# Patient Record
Sex: Female | Born: 1990 | Race: Black or African American | Hispanic: No | Marital: Single | Smoking: Current every day smoker
Health system: Southern US, Community
[De-identification: ages and names within clinical notes are randomized; demographics above are authoritative.]

## PROBLEM LIST (undated history)

## (undated) DIAGNOSIS — Z789 Other specified health status: Secondary | ICD-10-CM

## (undated) DIAGNOSIS — N904 Leukoplakia of vulva: Secondary | ICD-10-CM

## (undated) HISTORY — DX: Leukoplakia of vulva: N90.4

## (undated) HISTORY — PX: FOOT SURGERY: SHX648

---

## 2008-12-02 LAB — CHLAMYDIA/GC PCR
Chlamydia amplified: NEGATIVE
N. gonorrhea, amplified: NEGATIVE

## 2008-12-02 LAB — CULTURE, URINE
Culture result:: 40000
Culture: 40000

## 2010-12-18 LAB — METABOLIC PANEL, COMPREHENSIVE
A-G Ratio: 0.9 (ref 0.8–1.7)
ALT (SGPT): 33 U/L (ref 30–65)
AST (SGOT): 17 U/L (ref 15–37)
Albumin: 3.6 g/dL (ref 3.4–5.0)
Alk. phosphatase: 68 U/L (ref 50–136)
Anion gap: 6 mmol/L (ref 5–15)
BUN/Creatinine ratio: 9 — ABNORMAL LOW (ref 12–20)
BUN: 10 MG/DL (ref 7–18)
Bilirubin, total: 0.5 MG/DL (ref 0.2–1.0)
CO2: 28 MMOL/L (ref 21–32)
Calcium: 8.4 MG/DL (ref 8.4–10.4)
Chloride: 104 MMOL/L (ref 100–108)
Creatinine: 1.1 MG/DL (ref 0.6–1.3)
GFR est AA: >60 mL/min/{1.73_m2} (ref >60)
GFR est non-AA: >60 mL/min/{1.73_m2} (ref >60)
Globulin: 4.1 g/dL — ABNORMAL HIGH (ref 2.0–4.0)
Glucose: 103 MG/DL — ABNORMAL HIGH (ref 74–99)
Potassium: 3.1 MMOL/L — ABNORMAL LOW (ref 3.5–5.5)
Protein, total: 7.7 g/dL (ref 6.4–8.2)
Sodium: 138 MMOL/L (ref 136–145)

## 2010-12-18 LAB — CBC WITH AUTOMATED DIFF
ABS. BASOPHILS: 0 10*3/uL (ref 0.0–0.06)
ABS. EOSINOPHILS: 0 10*3/uL (ref 0.0–0.4)
ABS. LYMPHOCYTES: 0.7 10*3/uL — ABNORMAL LOW (ref 0.9–3.6)
ABS. MONOCYTES: 0.4 10*3/uL (ref 0.05–1.2)
ABS. NEUTROPHILS: 5.8 10*3/uL (ref 1.8–8.0)
BASOPHILS: 0 % (ref 0–2)
EOSINOPHILS: 0 % (ref 0–5)
HCT: 38.5 % (ref 35.0–45.0)
HGB: 12.7 g/dL (ref 12.0–16.0)
LYMPHOCYTES: 10 % — ABNORMAL LOW (ref 21–52)
MCH: 26.2 PG (ref 24.0–34.0)
MCHC: 33 g/dL (ref 31.0–37.0)
MCV: 79.4 FL (ref 74.0–97.0)
MONOCYTES: 6 % (ref 3–10)
MPV: 10 FL (ref 9.2–11.8)
NEUTROPHILS: 84 % — ABNORMAL HIGH (ref 40–73)
PLATELET: 283 10*3/uL (ref 135–420)
RBC: 4.85 M/uL (ref 4.20–5.30)
RDW: 13.8 % (ref 11.6–14.5)
WBC: 6.9 10*3/uL (ref 4.6–13.2)

## 2010-12-18 LAB — URINALYSIS W/ RFLX MICROSCOPIC
Bilirubin: NEGATIVE
Blood: NEGATIVE
Glucose: NEGATIVE MG/DL
Ketone: NEGATIVE MG/DL
Nitrites: NEGATIVE
Protein: 30 MG/DL — AB
Specific gravity: 1.025 (ref 1.003–1.030)
Urobilinogen: 0.2 EU/DL (ref 0.2–1.0)
pH (UA): 6 (ref 5.0–8.0)

## 2010-12-18 LAB — URINE MICROSCOPIC ONLY
Epithelial cells: 30 /LPF (ref 0–5)
RBC: 2 /HPF (ref 0–5)
WBC: 12 /HPF (ref 0–5)

## 2010-12-18 LAB — LIPASE: Lipase: 77 U/L (ref 73–393)

## 2014-07-21 LAB — URINALYSIS W/ RFLX MICROSCOPIC
Bilirubin: NEGATIVE
Blood: NEGATIVE
Glucose: NEGATIVE mg/dL
Ketone: NEGATIVE mg/dL
Leukocyte Esterase: NEGATIVE
Nitrites: NEGATIVE
Specific gravity: >1.03 — ABNORMAL HIGH (ref 1.003–1.030)
Urobilinogen: 0.2 EU/dL (ref 0.2–1.0)
pH (UA): 6 (ref 5.0–8.0)

## 2014-07-21 LAB — CBC WITH AUTOMATED DIFF
ABS. BASOPHILS: 0 10*3/uL (ref 0.0–0.06)
ABS. EOSINOPHILS: 0 10*3/uL (ref 0.0–0.4)
ABS. LYMPHOCYTES: 1 10*3/uL (ref 0.9–3.6)
ABS. MONOCYTES: 0.3 10*3/uL (ref 0.05–1.2)
ABS. NEUTROPHILS: 5.1 10*3/uL (ref 1.8–8.0)
BASOPHILS: 0 % (ref 0–2)
EOSINOPHILS: 0 % (ref 0–5)
HCT: 37 % (ref 35.0–45.0)
HGB: 12.7 g/dL (ref 12.0–16.0)
LYMPHOCYTES: 16 % — ABNORMAL LOW (ref 21–52)
MCH: 27.1 PG (ref 24.0–34.0)
MCHC: 34.3 g/dL (ref 31.0–37.0)
MCV: 79.1 FL (ref 74.0–97.0)
MONOCYTES: 4 % (ref 3–10)
MPV: 10.2 FL (ref 9.2–11.8)
NEUTROPHILS: 80 % — ABNORMAL HIGH (ref 40–73)
PLATELET: 221 10*3/uL (ref 135–420)
RBC: 4.68 M/uL (ref 4.20–5.30)
RDW: 13.1 % (ref 11.6–14.5)
WBC: 6.4 10*3/uL (ref 4.6–13.2)

## 2014-07-21 LAB — CARDIAC PANEL,(CK, CKMB & TROPONIN)
CK - MB: 0.7 ng/ml (ref 0.5–3.6)
CK-MB Index: 0.5 % (ref 0.0–4.0)
CK: 150 U/L (ref 26–192)
Troponin-I, QT: <0.02 NG/ML (ref 0.00–0.06)

## 2014-07-21 LAB — METABOLIC PANEL, COMPREHENSIVE
A-G Ratio: 0.7 — ABNORMAL LOW (ref 0.8–1.7)
ALT (SGPT): 18 U/L (ref 13–56)
AST (SGOT): 10 U/L — ABNORMAL LOW (ref 15–37)
Albumin: 3 g/dL — ABNORMAL LOW (ref 3.4–5.0)
Alk. phosphatase: 59 U/L (ref 45–117)
Anion gap: 10 mmol/L (ref 3.0–18)
BUN/Creatinine ratio: 8 — ABNORMAL LOW (ref 12–20)
BUN: 7 MG/DL (ref 7.0–18)
Bilirubin, total: 0.2 MG/DL (ref 0.2–1.0)
CO2: 25 mmol/L (ref 21–32)
Calcium: 8.7 MG/DL (ref 8.5–10.1)
Chloride: 104 mmol/L (ref 100–108)
Creatinine: 0.91 MG/DL (ref 0.6–1.3)
GFR est AA: >60 mL/min/{1.73_m2} (ref >60)
GFR est non-AA: >60 mL/min/{1.73_m2} (ref >60)
Globulin: 4.1 g/dL — ABNORMAL HIGH (ref 2.0–4.0)
Glucose: 166 mg/dL — ABNORMAL HIGH (ref 74–99)
Potassium: 3.6 mmol/L (ref 3.5–5.5)
Protein, total: 7.1 g/dL (ref 6.4–8.2)
Sodium: 139 mmol/L (ref 136–145)

## 2014-07-21 LAB — TSH 3RD GENERATION: TSH: 3.75 u[IU]/mL — ABNORMAL HIGH (ref 0.36–3.74)

## 2014-07-21 LAB — HCG URINE, QL. - POC: Pregnancy test,urine (POC): NEGATIVE

## 2014-07-21 LAB — URINE MICROSCOPIC ONLY
RBC: 0 /hpf (ref 0–5)
WBC: 0 /hpf (ref 0–5)

## 2014-07-21 MED ORDER — ONDANSETRON 4 MG TAB, RAPID DISSOLVE
4 mg | ORAL_TABLET | Freq: Three times a day (TID) | ORAL | Status: AC | PRN
Start: 2014-07-21 — End: ?

## 2014-07-21 MED ORDER — SODIUM CHLORIDE 0.9% BOLUS IV
0.9 % | Freq: Once | INTRAVENOUS | Status: AC
Start: 2014-07-21 — End: 2014-07-21
  Administered 2014-07-21: 18:00:00 via INTRAVENOUS

## 2014-07-21 NOTE — ED Notes (Signed)
Patient states she around 0900 she started feeling weak, had a rapid heart rate and felt short of breath. Patient thought her blood sugar might be low so she ate some cookies, but it had no effect.

## 2014-07-21 NOTE — ED Provider Notes (Signed)
HPI Comments:   1:15 PM   Kristy Avila is a 23 y.o. female presenting to the ED w/ multiple complaints including: SOB/gasping for air while sleeping this AM, also palpitations, feeling weak, shaky and dizziness on standing. States symptoms started while walking out the door to go to work. Thought it was her blood sugar so she ate some food. Has experienced these symptoms 2 previous episodes, 3 mo ago and again last wk both while at work. Symptoms resolved on their own. She also feels sad and wants to cry but is unable to explain why. Denies depression, SI/HI.  No family hx of seizures. PMHx of hypothyroidism. PERC negative. Denies hx anxiety.     The history is provided by the patient. No language interpreter was used.   Written by Suzi Roots, ED Scribe, as dictated by Marden Noble, PA-C.      Past Medical History   Diagnosis Date   ??? Hypothyroidism         History reviewed. No pertinent past surgical history.      History reviewed. No pertinent family history.     History     Social History   ??? Marital Status: MARRIED     Spouse Name: N/A     Number of Children: N/A   ??? Years of Education: N/A     Occupational History   ??? Not on file.     Social History Main Topics   ??? Smoking status: Never Smoker    ??? Smokeless tobacco: Not on file   ??? Alcohol Use: No   ??? Drug Use: Not on file   ??? Sexual Activity: Not on file     Other Topics Concern   ??? Not on file     Social History Narrative   ??? No narrative on file                  ALLERGIES: Vicodin      Review of Systems   Constitutional: Negative for fever, chills, activity change, appetite change and fatigue.   HENT: Negative for congestion, rhinorrhea and sore throat.    Eyes: Negative.    Respiratory: Negative for cough, wheezing and stridor.    Cardiovascular: Negative for chest pain, palpitations and leg swelling.   Gastrointestinal: Negative for nausea, vomiting, abdominal pain and diarrhea.   Genitourinary: Negative for dysuria and difficulty urinating.    Musculoskeletal: Negative for myalgias, back pain, arthralgias, neck pain and neck stiffness.   Skin: Negative for color change and rash.   Neurological: Negative for tremors, seizures, syncope, speech difficulty, light-headedness and headaches.   Hematological: Negative.    Psychiatric/Behavioral: Positive for dysphoric mood. The patient is nervous/anxious.        Filed Vitals:    07/21/14 1248 07/21/14 1251 07/21/14 1303 07/21/14 1440   BP: 162/87 162/87  119/87   Pulse: 80 80  72   Temp: 98.1 ??F (36.7 ??C) 98.1 ??F (36.7 ??C)     Resp: 16 16     Height: 5' 6"  (1.676 m) 5' 6"  (1.676 m)     Weight: 117.935 kg (260 lb) 117.935 kg (260 lb)     SpO2: 100% 100% 100%             Physical Exam   Nursing note and vitals reviewed.  NURSING NOTE AND VITALS REVIEWED.    PSHX, PFHX, PMHX REVIEWED.    CONSTITUTIONAL: Well developed, well nourished. Awake & alert. No acute respiratory distress, non-toxic in  appearance, not diaphoretic. Afebrile. Laying comfortably on stretcher.     HEAD: Atraumatic, Normocephalic.    EYES: Pupils are equal, round, and reactive. Extra-ocular muscles are intact. Sclera are anicteric.     ENT: Ears normal to external inspection. Lt TM is unremarkable. Rt TM is unremarkable. Mucous membranes are moist. Nares patent. OP is clear, no exudates/erythema/edema. Uvula midline. No trismus. No stridor. Secretions controlled.     NECK:  No adenopathy. Neck is supple without meningismus.     CARDIOVASCULAR: Regular rate and rhythm, no murmurs, rubs, or gallops.  Peripheral pulses are 2+ and equal.     PULMONARY/CHEST:  Clear to auscultation bilaterally. No wheezing, rales or rhonchi. Patient speaking in full sentences without accessory muscle usage.     ABDOMINAL: Soft and non-distended. Active BS all quadrants. No tenderness. No rebound, guarding, or rigidity.      MUSCULOSKELETAL: Full range of motion in all extremities. No muscular tenderness. No peripheral edema.     SKIN:  Skin is warm and dry. No diaphoresis, rashes or lesions visible. Intact skin. No erythema, warmth, streaking, fluctuance or induration.    NEUROLOGICAL: Alert, awake. Age appropriate. Appropriately oriented. Normal speech. No facial asymmetry. No aphasia or dysphasia. Normal cerebellar function. No sensory deficits. No acute focal neurological deficits appreciated.         RESULTS:  RADIOLOGY RESULT  EKG FINDING  1:27 PM  Rhythm: normal sinus rhythm; rate: 75 bpm; Other findings: acute ischemic changes   Read by Marden Noble, PA-C at 12:57.  Written by Suzi Roots, ED Scribe, as dictated by Marden Noble, PA-C.       No orders to display        Labs Reviewed   CBC WITH AUTOMATED DIFF - Abnormal; Notable for the following:     NEUTROPHILS 80 (*)     LYMPHOCYTES 16 (*)     All other components within normal limits   METABOLIC PANEL, COMPREHENSIVE - Abnormal; Notable for the following:     Glucose 166 (*)     BUN/Creatinine ratio 8 (*)     AST 10 (*)     Albumin 3.0 (*)     Globulin 4.1 (*)     A-G Ratio 0.7 (*)     All other components within normal limits   URINALYSIS W/ RFLX MICROSCOPIC - Abnormal; Notable for the following:     Specific gravity >1.030 (*)     Protein TRACE (*)     All other components within normal limits   URINE MICROSCOPIC ONLY - Abnormal; Notable for the following:     Bacteria FEW (*)     All other components within normal limits   CARDIAC PANEL,(CK, CKMB & TROPONIN)   TSH, 3RD GENERATION   HCG URINE, QL. - POC   POC URINE PREGNANCY TEST       Recent Results (from the past 12 hour(s))   URINALYSIS W/ RFLX MICROSCOPIC    Collection Time: 07/21/14 12:52 PM   Result Value Ref Range    Color YELLOW      Appearance CLEAR      Specific gravity >1.030 (H) 1.003 - 1.030    pH (UA) 6.0 5.0 - 8.0      Protein TRACE (A) NEG mg/dL    Glucose NEGATIVE  NEG mg/dL    Ketone NEGATIVE  NEG mg/dL    Bilirubin NEGATIVE  NEG      Blood NEGATIVE  NEG      Urobilinogen 0.2  0.2 - 1.0 EU/dL     Nitrites NEGATIVE  NEG      Leukocyte Esterase NEGATIVE  NEG     URINE MICROSCOPIC ONLY    Collection Time: 07/21/14 12:52 PM   Result Value Ref Range    WBC 0 to 3 0 - 5 /hpf    RBC 0 to 3 0 - 5 /hpf    Epithelial cells FEW 0 - 5 /lpf    Bacteria FEW (A) NEG /hpf   CARDIAC PANEL,(CK, CKMB & TROPONIN)    Collection Time: 07/21/14  1:00 PM   Result Value Ref Range    CK 150 26 - 192 U/L    CK - MB 0.7 0.5 - 3.6 ng/ml    CK-MB Index 0.5 0.0 - 4.0 %    Troponin-I, Qt. <0.02 0.00 - 0.06 NG/ML   HCG URINE, QL. - POC    Collection Time: 07/21/14  1:02 PM   Result Value Ref Range    Pregnancy test,urine (POC) NEGATIVE  NEG     CBC WITH AUTOMATED DIFF    Collection Time: 07/21/14  1:10 PM   Result Value Ref Range    WBC 6.4 4.6 - 13.2 K/uL    RBC 4.68 4.20 - 5.30 M/uL    HGB 12.7 12.0 - 16.0 g/dL    HCT 37.0 35.0 - 45.0 %    MCV 79.1 74.0 - 97.0 FL    MCH 27.1 24.0 - 34.0 PG    MCHC 34.3 31.0 - 37.0 g/dL    RDW 13.1 11.6 - 14.5 %    PLATELET 221 135 - 420 K/uL    MPV 10.2 9.2 - 11.8 FL    NEUTROPHILS 80 (H) 40 - 73 %    LYMPHOCYTES 16 (L) 21 - 52 %    MONOCYTES 4 3 - 10 %    EOSINOPHILS 0 0 - 5 %    BASOPHILS 0 0 - 2 %    ABS. NEUTROPHILS 5.1 1.8 - 8.0 K/UL    ABS. LYMPHOCYTES 1.0 0.9 - 3.6 K/UL    ABS. MONOCYTES 0.3 0.05 - 1.2 K/UL    ABS. EOSINOPHILS 0.0 0.0 - 0.4 K/UL    ABS. BASOPHILS 0.0 0.0 - 0.06 K/UL    DF AUTOMATED     METABOLIC PANEL, COMPREHENSIVE    Collection Time: 07/21/14  1:10 PM   Result Value Ref Range    Sodium 139 136 - 145 mmol/L    Potassium 3.6 3.5 - 5.5 mmol/L    Chloride 104 100 - 108 mmol/L    CO2 25 21 - 32 mmol/L    Anion gap 10 3.0 - 18 mmol/L    Glucose 166 (H) 74 - 99 mg/dL    BUN 7 7.0 - 18 MG/DL    Creatinine 0.91 0.6 - 1.3 MG/DL    BUN/Creatinine ratio 8 (L) 12 - 20      GFR est AA >60 >60 ml/min/1.52m    GFR est non-AA >60 >60 ml/min/1.773m   Calcium 8.7 8.5 - 10.1 MG/DL    Bilirubin, total 0.2 0.2 - 1.0 MG/DL    ALT 18 13 - 56 U/L    AST 10 (L) 15 - 37 U/L     Alk. phosphatase 59 45 - 117 U/L    Protein, total 7.1 6.4 - 8.2 g/dL    Albumin 3.0 (L) 3.4 - 5.0 g/dL    Globulin 4.1 (H) 2.0 - 4.0 g/dL    A-G  Ratio 0.7 (L) 0.8 - 1.7        MDM  Number of Diagnoses or Management Options  Mild dehydration:   Diagnosis management comments:   DDx: anxiety, dehydration, lytes, thyroid disease, depression, anxiety    VSS, labs and ekg non diagnostic, TSH pending. Mildly dehydrated but clinically well appearing. Intermittent symptoms x 3 months lasting for short period w/ spontaneous resolution, most c/w depression/anxiety/fatigue. PERC negative. No CP/SOB/hypoxia/tachpnea so doubt PE/need for further testing. Per patient request will provide work note for today. Medically stable for discharge home w/ RTED for any acute changes. Pt agrees w/ plan.           Amount and/or Complexity of Data Reviewed  Clinical lab tests: ordered and reviewed (Cardiac panel, HCG Urine, CBC, CMP, UA, POC pregnancy, Urine microscope)  Tests in the medicine section of CPT??: ordered and reviewed (EKG)        MEDICATIONS GIVEN:  Medications   sodium chloride 0.9 % bolus infusion 1,000 mL (0 mL IntraVENous IV Completed 07/21/14 1431)      Procedures    PROGRESS NOTE:  1:26 PM   Initial assessment performed.  Written by Suzi Roots, ED Scribe, as dictated by Marden Noble, PA-C.    DISCHARGE NOTEWebb Laws Klonowski's results have been reviewed with patient and/or family. Patient and/or family has been counseled regarding diagnosis, treatment, and plan.  Patient and/or family verbally conveys understanding and agreement of the signs, symptoms, diagnosis, treatment and prognosis and additionally agrees to follow up as discussed.  Patient and/or family also agrees with the care-plan and conveys that all of his/her questions have been answered.  I have also provided discharge instructions for the patient and/or family that include: educational information regarding  their diagnosis and treatment, and list of reasons why they would want to return to the ED prior to their follow-up appointment, should patient's condition change.     CLINICAL IMPRESSION    1. Mild dehydration           AFTER VISIT PLAN    Follow-up Information     Follow up With Details Comments Contact Info    [facility] Schedule an appointment as soon as possible for a visit in 2 days Follow up [place], [place]    [facility] EMERGENCY DEPT  If symptoms worsen, As needed [address]  [place] [address]  [phone]          There are no discharge medications for this patient.      Written by Suzi Roots, ED Scribe, as dictated by Marden Noble, PA-C.    I agree with the above documentation as written by the scribe.  Marden Noble, PA-C

## 2014-07-21 NOTE — ED Notes (Signed)
I have reviewed discharge instructions with the patient.  The patient verbalized understanding. Work note and prescriptions given to patient. Patient armband removed and shredded.

## 2014-07-22 LAB — EKG, 12 LEAD, INITIAL
Atrial Rate: 75 {beats}/min
Calculated P Axis: 23 degrees
Calculated R Axis: 56 degrees
Calculated T Axis: 21 degrees
Diagnosis: NORMAL
P-R Interval: 126 ms
Q-T Interval: 416 ms
QRS Duration: 92 ms
QTC Calculation (Bezet): 464 ms
Ventricular Rate: 75 {beats}/min

## 2017-09-02 ENCOUNTER — Encounter (HOSPITAL_COMMUNITY): Payer: Self-pay | Admitting: Nurse Practitioner

## 2017-09-02 ENCOUNTER — Emergency Department (HOSPITAL_COMMUNITY)
Admission: EM | Admit: 2017-09-02 | Discharge: 2017-09-02 | Disposition: A | Discharge status: Home / Self Care | Payer: 59 | Attending: Emergency Medicine | Admitting: Emergency Medicine

## 2017-09-02 DIAGNOSIS — M7541 Impingement syndrome of right shoulder: Secondary | ICD-10-CM | POA: Insufficient documentation

## 2017-09-02 DIAGNOSIS — M25511 Pain in right shoulder: Secondary | ICD-10-CM | POA: Diagnosis present

## 2017-09-02 DIAGNOSIS — F17210 Nicotine dependence, cigarettes, uncomplicated: Secondary | ICD-10-CM | POA: Diagnosis not present

## 2017-09-02 MED ORDER — CYCLOBENZAPRINE HCL 10 MG PO TABS: 10.0000 mg | ORAL_TABLET | Freq: Two times a day (BID) | ORAL | 0 refills | Status: DC | PRN

## 2017-09-02 MED ORDER — NAPROXEN 500 MG PO TABS: 500.0000 mg | ORAL_TABLET | Freq: Two times a day (BID) | ORAL | 0 refills | Status: DC

## 2017-09-02 NOTE — ED Triage Notes (Signed)
Patient comes in via POV with complaints of right arm pain. Patient states she woke up and the pain was there. Reports the pain is constant and comes ago with sharp pain and worsens when she lifts arm or does anything with that arm. States the pain shoots from right shoulder to about her elbow. She reports taking ibuprofen without relief.

## 2017-09-02 NOTE — ED Provider Notes (Signed)
[facility] DEPT Provider Note   CSN: [id number] Arrival date & time: 09/02/17  0241     History   Chief Complaint Chief Complaint  Patient presents with  . Arm Pain    HPI Donna Gilbert is a 26 y.o. female.  Patient presents to the emergency department with chief complaint of right shoulder pain.  She states that she awakened with the pain this evening.  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  She complains of pain when she lifts her arm.  She complains of a pinching sensation in her shoulder.  She denies any other traumatic injury.  Denies any fevers or chills.  Denies any numbness, weakness, or tingling.   The history is provided by the patient. No language interpreter was used.    History reviewed. No pertinent past medical history.  There are no active problems to display for this patient.   History reviewed. No pertinent surgical history.  OB History    No data available       Home Medications    Prior to Admission medications   Not on File    Family History No family history on file.  Social History Social History  Substance Use Topics  . Smoking status: Current Every Day Smoker    Packs/day: 0.50    Types: Cigarettes  . Smokeless tobacco: Never Used  . Alcohol use No     Allergies   Vicodin [hydrocodone-acetaminophen]   Review of Systems Review of Systems  All other systems reviewed and are negative.    Physical Exam Updated Vital Signs BP (!) 130/56 (BP Location: Left Arm)   Pulse 85   Temp 99.3 F (37.4 C)   Resp 18   Ht 5\' 5"  (1.651 m)   Wt 120.2 kg (265 lb)   SpO2 100%   BMI 44.10 kg/m   Physical Exam  Nursing note and vitals reviewed.  Constitutional: Pt appears well-developed and well-nourished. No distress.  HENT:  Head: Normocephalic and atraumatic.  Eyes: Conjunctivae are normal.  Neck: Normal range of motion.    Cardiovascular: Normal rate, regular rhythm. Intact distal pulses.   Capillary refill < 3 sec.  Pulmonary/Chest: Effort normal and breath sounds normal.  Musculoskeletal:  Right shoulder Pt exhibits TTP over the right upper trapezius, positive hawkins-kennedy, negative empty can, no bony abnormality or deformity .   ROM: 5/5  Strength: 5/5/  Neurological: Pt  is alert. Coordination normal.  Sensation: 5/5 Skin: Skin is warm and dry. Pt is not diaphoretic.  No evidence of open wound or skin tenting Psychiatric: Pt has a normal mood and affect.    ED Treatments / Results  Labs (all labs ordered are listed, but only abnormal results are displayed) Labs Reviewed - No data to display  EKG  EKG Interpretation None       Radiology No results found.  Procedures Procedures (including critical care time)  Medications Ordered in ED Medications - No data to display   Initial Impression / Assessment and Plan / ED Course  I have reviewed the triage vital signs and the nursing notes.  Pertinent labs & imaging results that were available during my care of the patient were reviewed by me and considered in my medical decision making (see chart for details).     Patient with signs and symptoms consistent with shoulder impingement syndrome.  Recommend ice, rest, and NSAIDs.  Recommend orthopedic follow-up.  Patient understands agrees to plan.  She is stable and ready for discharge.  Final Clinical Impressions(s) / ED Diagnoses   Final diagnoses:  Impingement syndrome of right shoulder    New Prescriptions New Prescriptions   CYCLOBENZAPRINE (FLEXERIL) 10 MG TABLET    Take 1 tablet (10 mg total) by mouth 2 (two) times daily as needed for muscle spasms.   NAPROXEN (NAPROSYN) 500 MG TABLET    Take 1 tablet (500 mg total) by mouth 2 (two) times daily with a meal.     Montine Circle, PA-C 09/02/17 0305    Shanon Rosser, MD 09/02/17 [phone]

## 2017-09-04 ENCOUNTER — Encounter (HOSPITAL_COMMUNITY): Payer: Self-pay | Admitting: Family Medicine

## 2017-09-04 ENCOUNTER — Ambulatory Visit (HOSPITAL_COMMUNITY)
Admission: EM | Admit: 2017-09-04 | Discharge: 2017-09-04 | Disposition: A | Discharge status: Home / Self Care | Payer: 59 | Attending: Family Medicine | Admitting: Family Medicine

## 2017-09-04 ENCOUNTER — Emergency Department (HOSPITAL_COMMUNITY)
Admission: EM | Admit: 2017-09-04 | Discharge: 2017-09-04 | Disposition: A | Discharge status: Home / Self Care | Payer: 59 | Attending: Emergency Medicine | Admitting: Emergency Medicine

## 2017-09-04 ENCOUNTER — Encounter (HOSPITAL_COMMUNITY): Payer: Self-pay | Admitting: *Deleted

## 2017-09-04 DIAGNOSIS — Z5321 Procedure and treatment not carried out due to patient leaving prior to being seen by health care provider: Secondary | ICD-10-CM | POA: Insufficient documentation

## 2017-09-04 DIAGNOSIS — R509 Fever, unspecified: Secondary | ICD-10-CM | POA: Diagnosis not present

## 2017-09-04 DIAGNOSIS — R69 Illness, unspecified: Secondary | ICD-10-CM | POA: Diagnosis not present

## 2017-09-04 DIAGNOSIS — J029 Acute pharyngitis, unspecified: Secondary | ICD-10-CM

## 2017-09-04 DIAGNOSIS — J111 Influenza due to unidentified influenza virus with other respiratory manifestations: Secondary | ICD-10-CM

## 2017-09-04 DIAGNOSIS — F509 Eating disorder, unspecified: Secondary | ICD-10-CM | POA: Diagnosis not present

## 2017-09-04 DIAGNOSIS — R0981 Nasal congestion: Secondary | ICD-10-CM

## 2017-09-04 DIAGNOSIS — M791 Myalgia, unspecified site: Secondary | ICD-10-CM | POA: Diagnosis not present

## 2017-09-04 DIAGNOSIS — R05 Cough: Secondary | ICD-10-CM

## 2017-09-04 LAB — RAPID STREP SCREEN (MED CTR MEBANE ONLY): Streptococcus, Group A Screen (Direct): NEGATIVE

## 2017-09-04 MED ORDER — OSELTAMIVIR PHOSPHATE 75 MG PO CAPS: 75.0000 mg | ORAL_CAPSULE | Freq: Two times a day (BID) | ORAL | 0 refills | Status: AC

## 2017-09-04 MED ORDER — IBUPROFEN 400 MG PO TABS
ORAL_TABLET | ORAL | Status: AC
  Filled 2017-09-04: qty 1

## 2017-09-04 MED ORDER — IBUPROFEN 200 MG PO TABS
ORAL_TABLET | ORAL | Status: AC
  Filled 2017-09-04: qty 1

## 2017-09-04 MED ORDER — IBUPROFEN 200 MG PO TABS
600.0000 mg | ORAL_TABLET | Freq: Once | ORAL | Status: AC
  Administered 2017-09-04: 09:00:00 600 mg via ORAL

## 2017-09-04 NOTE — ED Triage Notes (Addendum)
Pt here for URI symptoms, coughing up brown mucous and with sore throat. White patches to throat. Pt was just in the ER, strep negative and given motrin for fever of 102.

## 2017-09-04 NOTE — ED Provider Notes (Signed)
[facility]    CSN: [id number] Arrival date & time: 09/04/17  1017     History   Chief Complaint Chief Complaint  Patient presents with  . URI    HPI Donna Gilbert is a 26 y.o. female.   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 Patient was seen in the ED this morning and had a negative strep test completed. Patient also received 600mg  ibuprofen. She left prior to being seen and presented to urgent care. Afebrile on arrival. Complaining of one day of sore throat, nasal congestion, cough productive of brown sputum, generalized body aches and fever. Exposed to multiple sick contacts in the hospital. Denies headache, neck pain, neck stiffness, chest pain and shortness of breath    The history is provided by the patient.  URI  Presenting symptoms: congestion, cough, fatigue, fever and sore throat   Presenting symptoms: no ear pain   Severity:  Moderate Onset quality:  Gradual Duration:  2 days Timing:  Constant Progression:  Unchanged Chronicity:  New Relieved by:  OTC medications Worsened by:  Nothing Ineffective treatments:  OTC medications Associated symptoms: myalgias   Associated symptoms: no arthralgias, no headaches, no neck pain, no sinus pain, no sneezing, no swollen glands and no wheezing   Risk factors: sick contacts   Risk factors: not elderly, no chronic cardiac disease, no chronic kidney disease, no chronic respiratory disease, no diabetes mellitus, no immunosuppression, no recent illness and no recent travel     History reviewed. No pertinent past medical history.  There are no active problems to display for this patient.   History reviewed. No pertinent surgical history.  OB History    No data available       Home Medications    Prior to Admission medications   Medication Sig Start Date End Date Taking? Authorizing Provider  cyclobenzaprine  (FLEXERIL) 10 MG tablet Take 1 tablet (10 mg total) by mouth 2 (two) times daily as needed for muscle spasms. 09/02/17   Montine Circle, PA-C  naproxen (NAPROSYN) 500 MG tablet Take 1 tablet (500 mg total) by mouth 2 (two) times daily with a meal. 09/02/17   Montine Circle, PA-C  oseltamivir (TAMIFLU) 75 MG capsule Take 1 capsule (75 mg total) by mouth every 12 (twelve) hours. 09/04/17 09/09/17  Phebe Colla, PA-C    Family History History reviewed. No pertinent family history.  Social History Social History  Substance Use Topics  . Smoking status: Current Every Day Smoker    Packs/day: 0.50    Types: Cigarettes  . Smokeless tobacco: Never Used  . Alcohol use No     Allergies   Vicodin [hydrocodone-acetaminophen]   Review of Systems Review of Systems  Constitutional: Positive for fatigue and fever. Negative for chills.  HENT: Positive for congestion and sore throat. Negative for dental problem, drooling, ear pain, facial swelling, mouth sores, sinus pain and sneezing.   Eyes: Negative for pain and visual disturbance.  Respiratory: Positive for cough. Negative for shortness of breath and wheezing.   Cardiovascular: Negative for chest pain and palpitations.  Gastrointestinal: Negative for abdominal pain and vomiting.  Genitourinary: Negative for dysuria and hematuria.  Musculoskeletal: Positive for myalgias. Negative for arthralgias, back pain and neck pain.  Skin: Negative for color change and rash.  Neurological: Negative for seizures, syncope and headaches.  All other systems reviewed and are negative.    Physical Exam  Triage Vital Signs ED Triage Vitals  Enc Vitals Group     BP 09/04/17 1107 118/79     Pulse Rate 09/04/17 1107 90     Resp 09/04/17 1107 18     Temp 09/04/17 1107 98.8 F (37.1 C)     Temp src --      SpO2 09/04/17 1107 100 %     Weight --      Height --      Head Circumference --      Peak Flow --      Pain Score 09/04/17 1105 6     Pain  Loc --      Pain Edu? --      Excl. in [place]? --    No data found.   Updated Vital Signs BP 118/79   Pulse 90   Temp 98.8 F (37.1 C)   Resp 18   LMP 08/05/2017 (Approximate)   SpO2 100%   Visual Acuity Right Eye Distance:   Left Eye Distance:   Bilateral Distance:    Right Eye Near:   Left Eye Near:    Bilateral Near:     Physical Exam  Constitutional: She appears well-developed and well-nourished. No distress.  HENT:  Head: Normocephalic and atraumatic.  Right Ear: Hearing, tympanic membrane, external ear and ear canal normal.  Left Ear: Hearing, tympanic membrane, external ear and ear canal normal.  Nose: Nose normal.  Mouth/Throat: Oropharyngeal exudate, posterior oropharyngeal edema and posterior oropharyngeal erythema present. No tonsillar abscesses.  Eyes: Conjunctivae are normal.  Neck: Neck supple.  Cardiovascular: Normal rate and regular rhythm.   No murmur heard. Pulmonary/Chest: Effort normal and breath sounds normal. No respiratory distress. She has no decreased breath sounds. She has no wheezes. She has no rhonchi. She has no rales.  Abdominal: Soft. There is no tenderness.  Musculoskeletal: She exhibits no edema.  Neurological: She is alert.  Skin: Skin is warm and dry.  Psychiatric: She has a normal mood and affect.  Nursing note and vitals reviewed.    UC Treatments / Results  Labs (all labs ordered are listed, but only abnormal results are displayed) Labs Reviewed - No data to display  EKG  EKG Interpretation None       Radiology No results found.  Procedures Procedures (including critical care time)  Medications Ordered in UC Medications - No data to display   Initial Impression / Assessment and Plan / UC Course  I have reviewed the triage vital signs and the nursing notes.  Pertinent labs & imaging results that were available during my care of the patient were reviewed by me and considered in my medical decision making (see  chart for details).     Flu like symptoms. Negative strep done in the ED Will treat empirically for flu due to symptoms.   Final Clinical Impressions(s) / UC Diagnoses   Final diagnoses:  Influenza-like illness    New Prescriptions New Prescriptions   OSELTAMIVIR (TAMIFLU) 75 MG CAPSULE    Take 1 capsule (75 mg total) by mouth every 12 (twelve) hours.     Controlled Substance Prescriptions  Controlled Substance Registry consulted? Not Applicable   Phebe Colla, [place] 09/04/17 1131

## 2017-09-04 NOTE — ED Triage Notes (Signed)
Pt c/o productive cough with brown sputum, sore throat, and generalized body aches x 1 wk, pt has white patch to R tonsil upon inspection, pt febrile in triage, A&O x4

## 2017-09-04 NOTE — ED Notes (Signed)
Received call from urgent care pt is at their facility, will discharge [facility] after triage

## 2017-09-05 DIAGNOSIS — Z01419 Encounter for gynecological examination (general) (routine) without abnormal findings: Secondary | ICD-10-CM | POA: Diagnosis not present

## 2017-09-05 DIAGNOSIS — Z113 Encounter for screening for infections with a predominantly sexual mode of transmission: Secondary | ICD-10-CM | POA: Diagnosis not present

## 2017-09-05 DIAGNOSIS — N760 Acute vaginitis: Secondary | ICD-10-CM | POA: Diagnosis not present

## 2017-09-05 DIAGNOSIS — Z6837 Body mass index (BMI) 37.0-37.9, adult: Secondary | ICD-10-CM | POA: Diagnosis not present

## 2017-09-06 LAB — CULTURE, GROUP A STREP (THRC)

## 2017-09-10 DIAGNOSIS — Z3202 Encounter for pregnancy test, result negative: Secondary | ICD-10-CM | POA: Diagnosis not present

## 2017-09-10 DIAGNOSIS — Z30013 Encounter for initial prescription of injectable contraceptive: Secondary | ICD-10-CM | POA: Diagnosis not present

## 2017-10-08 ENCOUNTER — Inpatient Hospital Stay (HOSPITAL_COMMUNITY)
Admission: AD | Admit: 2017-10-08 | Discharge: 2017-10-08 | Disposition: A | Discharge status: Home / Self Care | Payer: 59 | Source: Ambulatory Visit | Attending: Obstetrics and Gynecology | Admitting: Obstetrics and Gynecology

## 2017-10-08 ENCOUNTER — Encounter (HOSPITAL_COMMUNITY): Payer: Self-pay

## 2017-10-08 DIAGNOSIS — F17210 Nicotine dependence, cigarettes, uncomplicated: Secondary | ICD-10-CM | POA: Diagnosis not present

## 2017-10-08 DIAGNOSIS — N926 Irregular menstruation, unspecified: Secondary | ICD-10-CM | POA: Diagnosis not present

## 2017-10-08 DIAGNOSIS — N939 Abnormal uterine and vaginal bleeding, unspecified: Secondary | ICD-10-CM | POA: Diagnosis present

## 2017-10-08 HISTORY — DX: Other specified health status: Z78.9

## 2017-10-08 LAB — URINALYSIS, ROUTINE W REFLEX MICROSCOPIC
Bilirubin Urine: NEGATIVE
GLUCOSE, UA: NEGATIVE mg/dL
Ketones, ur: NEGATIVE mg/dL
Nitrite: NEGATIVE
PH: 5 (ref 5.0–8.0)
Protein, ur: 100 mg/dL — AB
SPECIFIC GRAVITY, URINE: 1.013 (ref 1.005–1.030)

## 2017-10-08 LAB — WET PREP, GENITAL
CLUE CELLS WET PREP: NONE SEEN
SPERM: NONE SEEN
TRICH WET PREP: NONE SEEN
Yeast Wet Prep HPF POC: NONE SEEN

## 2017-10-08 LAB — POCT PREGNANCY, URINE: Preg Test, Ur: NEGATIVE

## 2017-10-08 NOTE — Progress Notes (Signed)
Provider at bs for [facility] wetprep and pelvic exam.

## 2017-10-08 NOTE — Discharge Instructions (Signed)

## 2017-10-08 NOTE — MAU Note (Addendum)
Pt is having a small amount of bleeding outside of her regular period. It has been going on about a week. Started after she had intercourse last week. Called the office and was told to come to MAU.

## 2017-10-08 NOTE — MAU Provider Note (Signed)
History  CSN: [id number] Arrival date and time: 10/08/17 1436  First Provider Initiated Contact with Patient 10/08/17 1515      Chief Complaint  Patient presents with  . Vaginal Bleeding    HPI: Maebelle Sulton is a 26 y.o. G2P1011 who presents to maternity admissions reporting abnormal vaginal bleeding. She reports her LMP was around 2 weeks ago, and started bleeding again 5 days ago. Reports that her menses are typically irregular, but this feels different than menses. Reports bleeding started having sex last week. Endorses some cramping. Denies vaginal odor, fever, chills, nausea, vomiting, lightheadedness or dizziness. Bleeding is lighter today. She reports being on DepoProvera for birth control, and just had this restarted a month ago.   OB History  Gravida Para Term Preterm AB Living  2 1 1   1 1   SAB TAB Ectopic Multiple Live Births  1       1    # Outcome Date GA Lbr Len/2nd Weight Sex Delivery Anes PTL Lv  2 SAB           1 Term      Vag-Spont   LIV     Past Medical History:  Diagnosis Date  . Medical history non-contributory    Past Surgical History:  Procedure Laterality Date  . FOOT SURGERY     Social History   Socioeconomic History  . Marital status: Single    Spouse name: Not on file  . Number of children: Not on file  . Years of education: Not on file  . Highest education level: Not on file  Social Needs  . Financial resource strain: Not on file  . Food insecurity - worry: Not on file  . Food insecurity - inability: Not on file  . Transportation needs - medical: Not on file  . Transportation needs - non-medical: Not on file  Occupational History  . Not on file  Tobacco Use  . Smoking status: Current Every Day Smoker    Packs/day: 0.50    Types: Cigarettes  . Smokeless tobacco: Never Used  Substance and Sexual Activity  . Alcohol use: No  . Drug use: No  . Sexual activity: Yes    Birth control/protection: Condom, Injection  Other Topics Concern   . Not on file  Social History Narrative  . Not on file   Allergies  Allergen Reactions  . Vicodin [Hydrocodone-Acetaminophen] Anaphylaxis    Medications Prior to Admission  Medication Sig Dispense Refill Last Dose  . cyclobenzaprine (FLEXERIL) 10 MG tablet Take 1 tablet (10 mg total) by mouth 2 (two) times daily as needed for muscle spasms. 20 tablet 0   . naproxen (NAPROSYN) 500 MG tablet Take 1 tablet (500 mg total) by mouth 2 (two) times daily with a meal. 30 tablet 0     I have reviewed patient's Past Medical Hx, Surgical Hx, Family Hx, Social Hx, medications and allergies.   Review of Systems: Negative except for what is mentioned in HPI.  Physical Exam   Blood pressure 127/71, pulse 66, temperature 99 F (37.2 C), temperature source Oral, resp. rate 16, last menstrual period 09/27/2017, SpO2 98 %.  Constitutional: Well-developed, well-nourished female in no acute distress.  HENT: [place]/AT, normal oropharynx mucosa. MMM Eyes: normal conjunctivae, no scleral icterus Cardiovascular: normal rate Respiratory: normal effort GI: Abd soft, non-tender, non-distended, normoactive bowel sounds Pelvic: NEFG. Normal vaginal mucosa without lesions; small amount of blood in vaginal vault. Cervix pink, without lesions. Cervix firm, neg CMT, uterus nontender,  nonenlarged, adnexa without tenderness, enlargement, or mass MSK: Extremities nontender, no edema Neurologic: Alert and oriented x 4. Psych: Normal mood and affect Skin: warm and dry    MAU Course/MDM:   Nursing notes and VS reviewed. Patient seen and examined, as noted above. Exam reassuring. VS wnl.   UPT negative. Wet prep, GC/Chlamydia collected  Results reviewed:  Results for orders placed or performed during the hospital encounter of 10/08/17  Wet prep, genital  Result Value Ref Range   Yeast Wet Prep HPF POC NONE SEEN NONE SEEN   Trich, Wet Prep NONE SEEN NONE SEEN   Clue Cells Wet Prep HPF POC NONE SEEN NONE SEEN    WBC, Wet Prep HPF POC FEW (A) NONE SEEN   Sperm NONE SEEN   Urinalysis, Routine w reflex microscopic  Result Value Ref Range   Color, Urine AMBER (A) YELLOW   APPearance HAZY (A) CLEAR   Specific Gravity, Urine 1.013 1.005 - 1.030   pH 5.0 5.0 - 8.0   Glucose, UA NEGATIVE NEGATIVE mg/dL   Hgb urine dipstick LARGE (A) NEGATIVE   Bilirubin Urine NEGATIVE NEGATIVE   Ketones, ur NEGATIVE NEGATIVE mg/dL   Protein, ur 100 (A) NEGATIVE mg/dL   Nitrite NEGATIVE NEGATIVE   Leukocytes, UA TRACE (A) NEGATIVE   RBC / HPF TOO NUMEROUS TO COUNT 0 - 5 RBC/hpf   WBC, UA 6-30 0 - 5 WBC/hpf   Bacteria, UA FEW (A) NONE SEEN   Squamous Epithelial / LPF 0-5 (A) NONE SEEN   Mucus PRESENT   Pregnancy, urine POC  Result Value Ref Range   Preg Test, Ur NEGATIVE NEGATIVE   Discussed with patient that irregular bleeding likely d/t recently restarting DepoProvera. Discussed expected course of bleeding on Depo, and precautions if persistent, and/or heavier bleeding.   Discussed with Dr. Julien Girt.   Assessment and Plan  Assessment: 1. Irregular menses     Plan: --Discharge home in stable condition.  --Gyn follow up outpatient --Return precautions   Niemah Schwebke, Jenne Pane, MD 10/08/2017 4:25 PM

## 2017-10-09 LAB — GC/CHLAMYDIA PROBE AMP (~~LOC~~) NOT AT ARMC
CHLAMYDIA, DNA PROBE: NEGATIVE
NEISSERIA GONORRHEA: NEGATIVE

## 2017-12-05 DIAGNOSIS — Z3200 Encounter for pregnancy test, result unknown: Secondary | ICD-10-CM | POA: Diagnosis not present

## 2017-12-05 DIAGNOSIS — L900 Lichen sclerosus et atrophicus: Secondary | ICD-10-CM | POA: Diagnosis not present

## 2017-12-05 DIAGNOSIS — Z3042 Encounter for surveillance of injectable contraceptive: Secondary | ICD-10-CM | POA: Diagnosis not present

## 2018-02-20 DIAGNOSIS — Z3042 Encounter for surveillance of injectable contraceptive: Secondary | ICD-10-CM | POA: Diagnosis not present

## 2018-03-19 DIAGNOSIS — R309 Painful micturition, unspecified: Secondary | ICD-10-CM | POA: Diagnosis not present

## 2018-03-19 DIAGNOSIS — N760 Acute vaginitis: Secondary | ICD-10-CM | POA: Diagnosis not present

## 2018-03-19 DIAGNOSIS — Z113 Encounter for screening for infections with a predominantly sexual mode of transmission: Secondary | ICD-10-CM | POA: Diagnosis not present

## 2018-03-21 ENCOUNTER — Encounter: Payer: Self-pay | Admitting: Gastroenterology

## 2018-03-26 DIAGNOSIS — J3501 Chronic tonsillitis: Secondary | ICD-10-CM | POA: Diagnosis not present

## 2018-04-09 ENCOUNTER — Encounter: Payer: Self-pay | Admitting: Gastroenterology

## 2018-04-09 ENCOUNTER — Other Ambulatory Visit (INDEPENDENT_AMBULATORY_CARE_PROVIDER_SITE_OTHER): Payer: 59

## 2018-04-09 ENCOUNTER — Ambulatory Visit (INDEPENDENT_AMBULATORY_CARE_PROVIDER_SITE_OTHER): Payer: 59 | Admitting: Gastroenterology

## 2018-04-09 ENCOUNTER — Encounter (INDEPENDENT_AMBULATORY_CARE_PROVIDER_SITE_OTHER): Payer: Self-pay

## 2018-04-09 VITALS — BP 112/72 | HR 86 | Ht 66.0 in | Wt 222.0 lb

## 2018-04-09 DIAGNOSIS — R12 Heartburn: Secondary | ICD-10-CM

## 2018-04-09 DIAGNOSIS — R634 Abnormal weight loss: Secondary | ICD-10-CM

## 2018-04-09 DIAGNOSIS — R110 Nausea: Secondary | ICD-10-CM | POA: Insufficient documentation

## 2018-04-09 DIAGNOSIS — R6881 Early satiety: Secondary | ICD-10-CM | POA: Insufficient documentation

## 2018-04-09 LAB — CBC WITH DIFFERENTIAL/PLATELET
Basophils Absolute: 0.1 10*3/uL (ref 0.0–0.1)
Basophils Relative: 1.1 % (ref 0.0–3.0)
EOS ABS: 0.3 10*3/uL (ref 0.0–0.7)
EOS PCT: 4.4 % (ref 0.0–5.0)
HCT: 38.9 % (ref 36.0–46.0)
HEMOGLOBIN: 13.2 g/dL (ref 12.0–15.0)
Lymphocytes Relative: 30.8 % (ref 12.0–46.0)
Lymphs Abs: 2 10*3/uL (ref 0.7–4.0)
MCHC: 33.9 g/dL (ref 30.0–36.0)
MCV: 86.9 fl (ref 78.0–100.0)
MONO ABS: 0.4 10*3/uL (ref 0.1–1.0)
Monocytes Relative: 6.7 % (ref 3.0–12.0)
Neutro Abs: 3.7 10*3/uL (ref 1.4–7.7)
Neutrophils Relative %: 57 % (ref 43.0–77.0)
Platelets: 257 10*3/uL (ref 150.0–400.0)
RBC: 4.48 Mil/uL (ref 3.87–5.11)
RDW: 13.3 % (ref 11.5–15.5)
WBC: 6.5 10*3/uL (ref 4.0–10.5)

## 2018-04-09 LAB — COMPREHENSIVE METABOLIC PANEL
ALBUMIN: 3.7 g/dL (ref 3.5–5.2)
ALK PHOS: 50 U/L (ref 39–117)
ALT: 12 U/L (ref 0–35)
AST: 12 U/L (ref 0–37)
BUN: 7 mg/dL (ref 6–23)
CO2: 26 mEq/L (ref 19–32)
CREATININE: 0.67 mg/dL (ref 0.40–1.20)
Calcium: 9.1 mg/dL (ref 8.4–10.5)
Chloride: 107 mEq/L (ref 96–112)
GFR: 134.04 mL/min (ref >60.00)
Glucose, Bld: 95 mg/dL (ref 70–99)
POTASSIUM: 3.4 meq/L — AB (ref 3.5–5.1)
SODIUM: 141 meq/L (ref 135–145)
TOTAL PROTEIN: 6.9 g/dL (ref 6.0–8.3)
Total Bilirubin: 0.4 mg/dL (ref 0.2–1.2)

## 2018-04-09 LAB — TSH: TSH: 2.84 u[IU]/mL (ref 0.35–4.50)

## 2018-04-09 MED ORDER — PANTOPRAZOLE SODIUM 40 MG PO TBEC: 40.0000 mg | DELAYED_RELEASE_TABLET | Freq: Every day | ORAL | 2 refills | Status: DC

## 2018-04-09 NOTE — Progress Notes (Signed)
04/09/2018 Donna Gilbert [id number] [birth year]   HISTORY OF PRESENT ILLNESS:  This is a pleasant 27 year old female who is new to our office.  She was referred here by her gynecologist, Dr. Royston Sinner, for evaluation regarding several GI complaints.  She tells me that her GI issues go back to when she was in middle school.  A lot of the same complaints she is going to talk about today she had evaluated about 10 years ago in [place], [place].  She tells me at that time she had an EGD, colonoscopy, and gastric emptying scan.  She was only found to have was a small hiatal hernia according to her report.  She says that her symptoms have continued since that time, but have become much worse.  She complains mostly of poor appetite, which is mostly triggered by upper abdominal fullness when she eats even only very small amounts.  She tells me that she will go days at a time without eating and has to remind herself to eat.  She denies any actual abdominal pain, but reports some epigastric fullness even after just a few bites of food.  She has lost about 30 pounds over the past 2 to 3 months.  She has reflux for which she takes Tums on a regular basis, about 4 per day  She recalls trying zantac when she had her work-up previously, but only took it for short time.  She reports varying bowel movements.  She will have about 1 day a week where she will have a couple of episodes of loose stool/diarrhea, but that the other days will be normal.  Does not have any nocturnal bowel movements.  Denies rectal bleeding or black stools.  She is fairly new to the [place] area.   Past Medical History:  Diagnosis Date  . Medical history non-contributory    Past Surgical History:  Procedure Laterality Date  . FOOT SURGERY      reports that she has been smoking cigarettes.  She has been smoking about 0.50 packs per day. She has never used smokeless tobacco. She reports that she does not drink alcohol or use  drugs. family history includes Breast cancer in her paternal grandmother; Pancreatic cancer in her maternal grandfather. Allergies  Allergen Reactions  . Vicodin [Hydrocodone-Acetaminophen] Anaphylaxis      Outpatient Encounter Medications as of 04/09/2018  Medication Sig  . medroxyPROGESTERone (DEPO-PROVERA) 150 MG/ML injection INJECT INTRAMUSCULARLY EVERY 3 MONTHS.  . [DISCONTINUED] cyclobenzaprine (FLEXERIL) 10 MG tablet Take 1 tablet (10 mg total) by mouth 2 (two) times daily as needed for muscle spasms. (Patient not taking: Reported on 10/08/2017)  . [DISCONTINUED] naproxen (NAPROSYN) 500 MG tablet Take 1 tablet (500 mg total) by mouth 2 (two) times daily with a meal. (Patient not taking: Reported on 10/08/2017)   No facility-administered encounter medications on file as of 04/09/2018.      REVIEW OF SYSTEMS  : All other systems reviewed and negative except where noted in the History of Present Illness.   PHYSICAL EXAM: BP 112/72   Pulse 86   Ht 5\' 6"  (1.676 m)   Wt 222 lb (100.7 kg)   BMI 35.83 kg/m  General: Well developed black female in no acute distress Head: Normocephalic and atraumatic Eyes:  Sclerae anicteric, conjunctiva pink. Ears: Normal auditory acuity Lungs: Clear throughout to auscultation; no increased WOB. Heart: Regular rate and rhythm; no M/R/G. Abdomen: Soft, non-distended.  BS present.  Epigastric and LUQ TTP. Musculoskeletal: Symmetrical with no  gross deformities  Skin: No lesions on visible extremities Extremities: No edema  Neurological: Alert oriented x 4, grossly non-focal Psychological:  Alert and cooperative. Normal mood and affect  ASSESSMENT AND PLAN: *27 year old female with >10 year history of GI complaints, particularly nausea, early satiety, reflux/heartburn, poor appetite, intermittent vomiting, and weight loss of about 30 pounds recently.  Had previous EGD, colonoscopy, GES for same complaints 10 years ago that were reportedly normal.  Will  start with general labs including CBC, CMP, TSH.  Will repeat EGD since she had some epigastric and LUQ TTP; schedule with Dr. Silverio Decamp.  Will start pantoprazole 40 mg daily.  **The risks, benefits, and alternatives to EGD were discussed with the patient and she consents to proceed.    CC:  Dr. Royston Sinner

## 2018-04-09 NOTE — Patient Instructions (Signed)
You have been scheduled for an endoscopy. Please follow written instructions given to you at your visit today. If you use inhalers (even only as needed), please bring them with you on the day of your procedure. Your physician has requested that you go to www.startemmi.com and enter the access code given to you at your visit today. This web site gives a general overview about your procedure. However, you should still follow specific instructions given to you by our office regarding your preparation for the procedure.  We have sent the following medications to your pharmacy for you to pick up at your convenience: Pantoprazole 40 mg daily   Your provider has requested that you go to the basement level for lab work before leaving today. Press "B" on the elevator. The lab is located at the first door on the left as you exit the elevator.

## 2018-04-10 ENCOUNTER — Encounter: Payer: Self-pay | Admitting: Gastroenterology

## 2018-04-10 ENCOUNTER — Ambulatory Visit (AMBULATORY_SURGERY_CENTER): Payer: 59 | Admitting: Gastroenterology

## 2018-04-10 ENCOUNTER — Other Ambulatory Visit: Payer: Self-pay

## 2018-04-10 VITALS — BP 142/89 | HR 73 | Temp 97.1°F | Resp 12 | Ht 66.0 in | Wt 222.0 lb

## 2018-04-10 DIAGNOSIS — R1012 Left upper quadrant pain: Secondary | ICD-10-CM | POA: Diagnosis not present

## 2018-04-10 DIAGNOSIS — K2980 Duodenitis without bleeding: Secondary | ICD-10-CM

## 2018-04-10 DIAGNOSIS — R12 Heartburn: Secondary | ICD-10-CM | POA: Diagnosis present

## 2018-04-10 DIAGNOSIS — K269 Duodenal ulcer, unspecified as acute or chronic, without hemorrhage or perforation: Secondary | ICD-10-CM

## 2018-04-10 DIAGNOSIS — K219 Gastro-esophageal reflux disease without esophagitis: Secondary | ICD-10-CM | POA: Diagnosis not present

## 2018-04-10 DIAGNOSIS — K319 Disease of stomach and duodenum, unspecified: Secondary | ICD-10-CM | POA: Diagnosis not present

## 2018-04-10 DIAGNOSIS — R1013 Epigastric pain: Secondary | ICD-10-CM

## 2018-04-10 DIAGNOSIS — K2990 Gastroduodenitis, unspecified, without bleeding: Secondary | ICD-10-CM

## 2018-04-10 DIAGNOSIS — K2970 Gastritis, unspecified, without bleeding: Secondary | ICD-10-CM

## 2018-04-10 DIAGNOSIS — R110 Nausea: Secondary | ICD-10-CM

## 2018-04-10 MED ORDER — SODIUM CHLORIDE 0.9 % IV SOLN: 500.0000 mL | Freq: Once | INTRAVENOUS | Status: AC

## 2018-04-10 NOTE — Op Note (Signed)
[facility] Patient Name: Donna Gilbert Procedure Date: 04/10/2018 8:46 AM MRN: [id number] Endoscopist: Mauri Pole , MD Age: 27 Referring MD:  Date of Birth: [birth year] Gender: Female Account #: [id number] Procedure:                Upper GI endoscopy Indications:              Epigastric abdominal pain, Abdominal pain in the                            left upper quadrant, Dyspepsia, Heartburn, Nausea Medicines:                Monitored Anesthesia Care Procedure:                Pre-Anesthesia Assessment:                           - Prior to the procedure, a History and Physical                            was performed, and patient medications and                            allergies were reviewed. The patient's tolerance of                            previous anesthesia was also reviewed. The risks                            and benefits of the procedure and the sedation                            options and risks were discussed with the patient.                            All questions were answered, and informed consent                            was obtained. Prior Anticoagulants: The patient has                            taken no previous anticoagulant or antiplatelet                            agents. ASA Grade Assessment: II - A patient with                            mild systemic disease. After reviewing the risks                            and benefits, the patient was deemed in                            satisfactory condition to undergo the procedure.  After obtaining informed consent, the endoscope was                            passed under direct vision. Throughout the                            procedure, the patient's blood pressure, pulse, and                            oxygen saturations were monitored continuously. The                            Model GIF-HQ190 [id number]) scope was introduced   through the mouth, and advanced to the second part                            of duodenum. The upper GI endoscopy was                            accomplished without difficulty. The patient                            tolerated the procedure well. Scope In: Scope Out: Findings:                 Esophagogastric landmarks were identified: the                            Z-line was found at 35 cm and the gastroesophageal                            junction was found at 35 cm from the incisors.                           Patchy moderately erythematous mucosa without                            bleeding was found in the gastric fundus, in the                            gastric body and in the prepyloric region of the                            stomach. Biopsies were taken with a cold forceps                            for Helicobacter pylori testing.                           Few non-bleeding cratered and superficial duodenal                            ulcers with no stigmata of bleeding were found in  the duodenal bulb. The largest lesion was 7 mm in                            largest dimension. Biopsies for histology were                            taken with a cold forceps for evaluation of celiac                            disease.                           The second portion of the duodenum was normal.                            Biopsies for histology were taken with a cold                            forceps for evaluation of celiac disease. Complications:            No immediate complications. Estimated Blood Loss:     Estimated blood loss was minimal. Impression:               - Esophagogastric landmarks identified.                           - Erythematous mucosa in the gastric fundus,                            gastric body and prepyloric region of the stomach.                            Biopsied.                           - Multiple non-bleeding duodenal ulcers  with no                            stigmata of bleeding. Biopsied.                           - Normal second portion of the duodenum. Biopsied. Recommendation:           - Patient has a contact number available for                            emergencies. The signs and symptoms of potential                            delayed complications were discussed with the                            patient. Return to normal activities tomorrow.                            Written discharge instructions were  provided to the                            patient.                           - Resume previous diet.                           - Continue present medications.                           - Await pathology results.                           - Return to GI office in 2 months. Mauri Pole, MD 04/10/2018 9:03:18 AM This report has been signed electronically.

## 2018-04-10 NOTE — Progress Notes (Signed)
A and O x3. Report to RN. Tolerated MAC anesthesia well.Teeth unchanged after procedure.

## 2018-04-10 NOTE — Progress Notes (Signed)
I have reviewed the patient's medical history in detail and updated the computerized patient record.

## 2018-04-10 NOTE — Progress Notes (Signed)
Called to room to assist during endoscopic procedure.  Patient ID and intended procedure confirmed with present staff. Received instructions for my participation in the procedure from the performing physician.  

## 2018-04-10 NOTE — Patient Instructions (Signed)
HANDOUT GIVEN FOR DUODENAL ULCER  RETURN TO DR NANDIGAM'S OFFICE ON WED AUG 7 AT 3PM FOR FOLLOW-UP  YOU HAD AN ENDOSCOPIC PROCEDURE TODAY AT [facility]:   Refer to the procedure report that was given to you for any specific questions about what was found during the examination.  If the procedure report does not answer your questions, please call your gastroenterologist to clarify.  If you requested that your care partner not be given the details of your procedure findings, then the procedure report has been included in a sealed envelope for you to review at your convenience later.  YOU SHOULD EXPECT: Some feelings of bloating in the abdomen. Passage of more gas than usual.  Walking can help get rid of the air that was put into your GI tract during the procedure and reduce the bloating. If you had a lower endoscopy (such as a colonoscopy or flexible sigmoidoscopy) you may notice spotting of blood in your stool or on the toilet paper. If you underwent a bowel prep for your procedure, you may not have a normal bowel movement for a few days.  Please Note:  You might notice some irritation and congestion in your nose or some drainage.  This is from the oxygen used during your procedure.  There is no need for concern and it should clear up in a day or so.  SYMPTOMS TO REPORT IMMEDIATELY:  Following upper endoscopy (EGD)  Vomiting of blood or coffee ground material  New chest pain or pain under the shoulder blades  Painful or persistently difficult swallowing  New shortness of breath  Fever of 100F or higher  Black, tarry-looking stools  For urgent or emergent issues, a gastroenterologist can be reached at any hour by calling [phone].   DIET:  We do recommend a small meal at first, but then you may proceed to your regular diet.  Drink plenty of fluids but you should avoid alcoholic beverages for 24 hours.  ACTIVITY:  You should plan to take it easy for the rest of today  and you should NOT DRIVE or use heavy machinery until tomorrow (because of the sedation medicines used during the test).    FOLLOW UP: Our staff will call the number listed on your records the next business day following your procedure to check on you and address any questions or concerns that you may have regarding the information given to you following your procedure. If we do not reach you, we will leave a message.  However, if you are feeling well and you are not experiencing any problems, there is no need to return our call.  We will assume that you have returned to your regular daily activities without incident.  If any biopsies were taken you will be contacted by phone or by letter within the next 1-3 weeks.  Please call us at [phone] if you have not heard about the biopsies in 3 weeks.    SIGNATURES/CONFIDENTIALITY: You and/or your care partner have signed paperwork which will be entered into your electronic medical record.  These signatures attest to the fact that that the information above on your After Visit Summary has been reviewed and is understood.  Full responsibility of the confidentiality of this discharge information lies with you and/or your care-partner.

## 2018-04-10 NOTE — Progress Notes (Signed)
Reviewed and agree with documentation and assessment and plan. K. Veena Aprile Dickenson , MD   

## 2018-04-11 ENCOUNTER — Telehealth: Payer: Self-pay

## 2018-04-11 NOTE — Telephone Encounter (Signed)
  Follow up Call-  Call back number 04/10/2018  Post procedure Call Back phone  # [phone]  Permission to leave phone message Yes     Patient questions:  Do you have a fever, pain , or abdominal swelling? No. Pain Score  0 *  Have you tolerated food without any problems? Yes.    Have you been able to return to your normal activities? Yes.    Do you have any questions about your discharge instructions: Diet   No. Medications  No. Follow up visit  No.  Do you have questions or concerns about your Care? No.  Actions: * If pain score is 4 or above: No action needed, pain <4.

## 2018-04-16 ENCOUNTER — Encounter: Payer: Self-pay | Admitting: Gastroenterology

## 2018-05-08 DIAGNOSIS — Z3042 Encounter for surveillance of injectable contraceptive: Secondary | ICD-10-CM | POA: Diagnosis not present

## 2018-05-14 ENCOUNTER — Ambulatory Visit: Payer: 59 | Admitting: Family Medicine

## 2018-06-12 ENCOUNTER — Ambulatory Visit: Payer: 59 | Admitting: Gastroenterology

## 2018-07-26 DIAGNOSIS — Z3042 Encounter for surveillance of injectable contraceptive: Secondary | ICD-10-CM | POA: Diagnosis not present

## 2018-09-17 ENCOUNTER — Encounter: Payer: Self-pay | Admitting: Nurse Practitioner

## 2018-09-17 ENCOUNTER — Ambulatory Visit: Payer: 59 | Attending: Nurse Practitioner | Admitting: Nurse Practitioner

## 2018-09-17 VITALS — BP 130/86 | HR 100 | Ht 67.0 in | Wt 231.2 lb

## 2018-09-17 DIAGNOSIS — Z885 Allergy status to narcotic agent status: Secondary | ICD-10-CM | POA: Insufficient documentation

## 2018-09-17 DIAGNOSIS — M25561 Pain in right knee: Secondary | ICD-10-CM | POA: Diagnosis not present

## 2018-09-17 DIAGNOSIS — Z6836 Body mass index (BMI) 36.0-36.9, adult: Secondary | ICD-10-CM | POA: Diagnosis not present

## 2018-09-17 DIAGNOSIS — E669 Obesity, unspecified: Secondary | ICD-10-CM | POA: Diagnosis not present

## 2018-09-17 DIAGNOSIS — N644 Mastodynia: Secondary | ICD-10-CM | POA: Diagnosis not present

## 2018-09-17 DIAGNOSIS — E876 Hypokalemia: Secondary | ICD-10-CM | POA: Diagnosis not present

## 2018-09-17 DIAGNOSIS — G8929 Other chronic pain: Secondary | ICD-10-CM | POA: Diagnosis not present

## 2018-09-17 DIAGNOSIS — F17200 Nicotine dependence, unspecified, uncomplicated: Secondary | ICD-10-CM

## 2018-09-17 DIAGNOSIS — F17210 Nicotine dependence, cigarettes, uncomplicated: Secondary | ICD-10-CM | POA: Diagnosis not present

## 2018-09-17 DIAGNOSIS — M25562 Pain in left knee: Secondary | ICD-10-CM

## 2018-09-17 NOTE — Progress Notes (Signed)
Assessment & Plan:  Freyja was seen today for new patient (initial visit) and knee pain.  Diagnoses and all orders for this visit:  Chronic pain of right knee -     DG Knee Complete 4 Views Right; Future -     CBC with Differential Work on losing weight to help reduce joint pain. May alternate with heat and ice application for pain relief. May also alternate with acetaminophen and Ibuprofen as prescribed pain relief. Other alternatives include massage, acupuncture and water aerobics.  You must stay active and avoid a sedentary lifestyle.   Breast pain -     MM Digital Diagnostic Bilat; Future  Obesity (BMI 35.0-39.9 without comorbidity) -     Lipid panel -     TSH Discussed diet and exercise for person with BMI >36. Instructed: You must burn more calories than you eat. Losing 5 percent of your body weight should be considered a success. In the longer term, losing more than 15 percent of your body weight and staying at this weight is an extremely good result. However, keep in mind that even losing 5 percent of your body weight leads to important health benefits, so try not to get discouraged if you're not able to lose more than this. Will recheck weight in 3-6 months.   Hypokalemia -     Comprehensive metabolic panel  Tobacco dependence Laportia was counseled on the dangers of tobacco use, and was advised to quit. Reviewed strategies to maximize success, including removing cigarettes and smoking materials from environment, stress management and support of family/friends as well as pharmacological alternatives including: Wellbutrin, Chantix, Nicotine patch, Nicotine gum or lozenges. Smoking cessation support: smoking cessation hotline: 1-800-QUIT-NOW.  Smoking cessation classes are also available through [facility]. Call [phone] or visit our website at [email or web address]   A total of 5 minutes was spent on counseling for smoking cessation and Latrice is  not ready to quit.    Patient has been counseled on age-appropriate routine health concerns for screening and prevention. These are reviewed and up-to-date. Referrals have been placed accordingly. Immunizations are up-to-date or declined.    Subjective:   Chief Complaint  Patient presents with  . New Patient (Initial Visit)    Pt. stated both of her nipples hurt, mostly the left.   . Knee Pain    pt. stated her right knee hurts for a year now.    HPI Inika Bellanger 27 y.o. female presents to office today to establish care. She endorses a history of lichen sclerosis (vagina), hypothyroidism (states she was prescribed thyroid medication then was taken off of it a short time later), Chronic right knee pain, and anemia (due to menorrhagia; now receiving DEPO and endorses amenorrhea)   Nipple Pain Bilaterally. Onset a few months ago. Aggravating factors: direct pressure, wearing her bras. Pain is described as sharp and stabbing. Compares pain to previous pain she experienced from having closed milk ducts with breast feeding. She denies any family history of breast cancer or any masses palpated on self breast exam. There is no drainage from her nipples.    Right Knee Pain Onset of pain over 1 year. 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 She endorses right ankle surgery 13 years ago however her pain in located in the patellar region. The most she has ever weighed was 280lbs last year. Now down 50 lbs after going  to divorce and moving to [place]. Pain is aggravated by weight bearing and direct pressure. Relieving factors: rest. She has not tried any OTC medications for pain as she does not like taking medication.     Review of Systems  Constitutional: Negative for fever, malaise/fatigue and weight loss.  HENT: Negative.  Negative for nosebleeds.   Eyes: Negative.  Negative for blurred vision, double vision and photophobia.  Respiratory:  Negative.  Negative for cough and shortness of breath.   Cardiovascular: Negative.  Negative for chest pain, palpitations and leg swelling.  Gastrointestinal: Negative.  Negative for heartburn, nausea and vomiting.  Musculoskeletal: Positive for joint pain. Negative for myalgias.       SEE HPI  Neurological: Negative.  Negative for dizziness, focal weakness, seizures and headaches.  Psychiatric/Behavioral: Negative.  Negative for suicidal ideas.    Past Medical History:  Diagnosis Date  . Lichen sclerosus of female genitalia   . Medical history non-contributory     Past Surgical History:  Procedure Laterality Date  . FOOT SURGERY      Family History  Problem Relation Age of Onset  . Breast cancer Paternal Grandmother   . Pancreatic cancer Maternal Grandfather   . Diabetes Maternal Grandfather   . Diabetes Mother   . Diabetes Maternal Grandmother     Social History Reviewed with no changes to be made today.   Outpatient Medications Prior to Visit  Medication Sig Dispense Refill  . medroxyPROGESTERone (DEPO-PROVERA) 150 MG/ML injection INJECT INTRAMUSCULARLY EVERY 3 MONTHS.  3  . pantoprazole (PROTONIX) 40 MG tablet Take 1 tablet (40 mg total) by mouth daily. (Patient not taking: Reported on 09/17/2018) 30 tablet 2   Facility-Administered Medications Prior to Visit  Medication Dose Route Frequency Provider Last Rate Last Dose  . 0.9 %  sodium chloride infusion  500 mL Intravenous Once Nandigam, Venia Minks, MD        Allergies  Allergen Reactions  . Vicodin [Hydrocodone-Acetaminophen] Anaphylaxis       Objective:    BP 130/86 (BP Location: Left Arm, Patient Position: Sitting, Cuff Size: Large)   Pulse 100   Ht 5\' 7"  (1.702 m)   Wt 231 lb 3.2 oz (104.9 kg)   SpO2 97%   BMI 36.21 kg/m  Wt Readings from Last 3 Encounters:  09/17/18 231 lb 3.2 oz (104.9 kg)  04/10/18 222 lb (100.7 kg)  04/09/18 222 lb (100.7 kg)    Physical Exam  Constitutional: She is oriented  to person, place, and time. She appears well-developed and well-nourished. She is cooperative.  HENT:  Head: Normocephalic and atraumatic.  Eyes: EOM are normal.  Neck: Normal range of motion.  Cardiovascular: Normal rate, regular rhythm and normal heart sounds. Exam reveals no gallop and no friction rub.  No murmur heard. Pulmonary/Chest: Effort normal and breath sounds normal. No tachypnea. No respiratory distress. She has no decreased breath sounds. She has no wheezes. She has no rhonchi. She has no rales. She exhibits no mass and no tenderness. Right breast exhibits tenderness. Right breast exhibits no inverted nipple, no mass, no nipple discharge and no skin change. Left breast exhibits tenderness. Left breast exhibits no inverted nipple, no mass, no nipple discharge and no skin change. There is breast tenderness (generalized). No breast swelling, discharge or bleeding. Breasts are symmetrical.  There were no non tender palpable masses present. She has generalized breast tenderness with palpation specifically around the areola and nipple of bilateral breasts. No obvious indication of infection.  Abdominal:  Bowel sounds are normal.  Musculoskeletal: Normal range of motion. She exhibits no edema.       Right knee: She exhibits normal range of motion and no swelling. Tenderness found. Medial joint line, lateral joint line and patellar tendon tenderness noted.  Neurological: She is alert and oriented to person, place, and time. Coordination normal.  Skin: Skin is warm and dry.  Psychiatric: She has a normal mood and affect. Her behavior is normal. Judgment and thought content normal.  Nursing note and vitals reviewed.      Patient has been counseled extensively about nutrition and exercise as well as the importance of adherence with medications and regular follow-up. The patient was given clear instructions to go to ER or return to medical center if symptoms don't improve, worsen or new problems  develop. The patient verbalized understanding.   Follow-up: Return in about 3 months (around 12/18/2018) for Physical ONLY no labs.   Gildardo Pounds, FNP-BC [facility] [place], [place]   09/17/2018, 2:10 PM

## 2018-09-17 NOTE — Patient Instructions (Signed)

## 2018-09-18 LAB — CBC WITH DIFFERENTIAL/PLATELET
BASOS: 1 %
Basophils Absolute: 0 10*3/uL (ref 0.0–0.2)
EOS (ABSOLUTE): 0.3 10*3/uL (ref 0.0–0.4)
Eos: 3 %
HEMATOCRIT: 39.2 % (ref 34.0–46.6)
Hemoglobin: 13.2 g/dL (ref 11.1–15.9)
Immature Grans (Abs): 0 10*3/uL (ref 0.0–0.1)
Immature Granulocytes: 0 %
LYMPHS ABS: 2.8 10*3/uL (ref 0.7–3.1)
Lymphs: 32 %
MCH: 29.4 pg (ref 26.6–33.0)
MCHC: 33.7 g/dL (ref 31.5–35.7)
MCV: 87 fL (ref 79–97)
MONOS ABS: 0.6 10*3/uL (ref 0.1–0.9)
Monocytes: 7 %
Neutrophils Absolute: 5 10*3/uL (ref 1.4–7.0)
Neutrophils: 57 %
Platelets: 286 10*3/uL (ref 150–450)
RBC: 4.49 x10E6/uL (ref 3.77–5.28)
RDW: 12.8 % (ref 12.3–15.4)
WBC: 8.7 10*3/uL (ref 3.4–10.8)

## 2018-09-18 LAB — COMPREHENSIVE METABOLIC PANEL
ALBUMIN: 4 g/dL (ref 3.5–5.5)
ALK PHOS: 54 IU/L (ref 39–117)
ALT: 10 IU/L (ref 0–32)
AST: 12 IU/L (ref 0–40)
Albumin/Globulin Ratio: 1.5 (ref 1.2–2.2)
BILIRUBIN TOTAL: 0.2 mg/dL (ref 0.0–1.2)
BUN / CREAT RATIO: 13 (ref 9–23)
BUN: 9 mg/dL (ref 6–20)
CHLORIDE: 105 mmol/L (ref 96–106)
CO2: 22 mmol/L (ref 20–29)
CREATININE: 0.7 mg/dL (ref 0.57–1.00)
Calcium: 9.3 mg/dL (ref 8.7–10.2)
GFR calc Af Amer: 135 mL/min/{1.73_m2} (ref >59)
GFR calc non Af Amer: 117 mL/min/{1.73_m2} (ref >59)
GLOBULIN, TOTAL: 2.7 g/dL (ref 1.5–4.5)
GLUCOSE: 85 mg/dL (ref 65–99)
Potassium: 3.7 mmol/L (ref 3.5–5.2)
SODIUM: 142 mmol/L (ref 134–144)
Total Protein: 6.7 g/dL (ref 6.0–8.5)

## 2018-09-18 LAB — LIPID PANEL
CHOLESTEROL TOTAL: 122 mg/dL (ref 100–199)
Chol/HDL Ratio: 3.4 ratio (ref 0.0–4.4)
HDL: 36 mg/dL — AB (ref >39)
LDL Calculated: 69 mg/dL (ref 0–99)
Triglycerides: 85 mg/dL (ref 0–149)
VLDL CHOLESTEROL CAL: 17 mg/dL (ref 5–40)

## 2018-09-18 LAB — TSH: TSH: 6.35 u[IU]/mL — ABNORMAL HIGH (ref 0.450–4.500)

## 2018-09-19 ENCOUNTER — Other Ambulatory Visit: Payer: Self-pay | Admitting: Nurse Practitioner

## 2018-09-19 DIAGNOSIS — N644 Mastodynia: Secondary | ICD-10-CM

## 2018-09-25 DIAGNOSIS — H52223 Regular astigmatism, bilateral: Secondary | ICD-10-CM | POA: Diagnosis not present

## 2018-09-26 ENCOUNTER — Ambulatory Visit
Admission: RE | Admit: 2018-09-26 | Discharge: 2018-09-26 | Disposition: A | Discharge status: Home / Self Care | Payer: 59 | Source: Ambulatory Visit | Attending: Nurse Practitioner | Admitting: Nurse Practitioner

## 2018-09-26 DIAGNOSIS — N6489 Other specified disorders of breast: Secondary | ICD-10-CM | POA: Diagnosis not present

## 2018-09-26 DIAGNOSIS — N644 Mastodynia: Secondary | ICD-10-CM

## 2018-10-15 DIAGNOSIS — Z01419 Encounter for gynecological examination (general) (routine) without abnormal findings: Secondary | ICD-10-CM | POA: Diagnosis not present

## 2018-10-15 DIAGNOSIS — Z3042 Encounter for surveillance of injectable contraceptive: Secondary | ICD-10-CM | POA: Diagnosis not present

## 2018-10-15 DIAGNOSIS — Z6837 Body mass index (BMI) 37.0-37.9, adult: Secondary | ICD-10-CM | POA: Diagnosis not present

## 2018-10-15 DIAGNOSIS — Z113 Encounter for screening for infections with a predominantly sexual mode of transmission: Secondary | ICD-10-CM | POA: Diagnosis not present

## 2018-10-15 DIAGNOSIS — N760 Acute vaginitis: Secondary | ICD-10-CM | POA: Diagnosis not present

## 2018-12-19 ENCOUNTER — Encounter: Payer: Self-pay | Admitting: Podiatry

## 2018-12-19 ENCOUNTER — Ambulatory Visit (INDEPENDENT_AMBULATORY_CARE_PROVIDER_SITE_OTHER): Payer: 59 | Admitting: Podiatry

## 2018-12-19 ENCOUNTER — Telehealth: Payer: Self-pay | Admitting: *Deleted

## 2018-12-19 ENCOUNTER — Ambulatory Visit (INDEPENDENT_AMBULATORY_CARE_PROVIDER_SITE_OTHER): Payer: 59

## 2018-12-19 VITALS — BP 127/85 | HR 88 | Resp 16

## 2018-12-19 DIAGNOSIS — M7750 Other enthesopathy of unspecified foot: Secondary | ICD-10-CM

## 2018-12-19 DIAGNOSIS — M7661 Achilles tendinitis, right leg: Secondary | ICD-10-CM

## 2018-12-19 DIAGNOSIS — M7751 Other enthesopathy of right foot: Secondary | ICD-10-CM

## 2018-12-19 DIAGNOSIS — M7662 Achilles tendinitis, left leg: Secondary | ICD-10-CM

## 2018-12-19 DIAGNOSIS — D492 Neoplasm of unspecified behavior of bone, soft tissue, and skin: Secondary | ICD-10-CM | POA: Diagnosis not present

## 2018-12-19 DIAGNOSIS — M7752 Other enthesopathy of left foot: Secondary | ICD-10-CM

## 2018-12-19 MED ORDER — METHYLPREDNISOLONE 4 MG PO TBPK: ORAL_TABLET | ORAL | 0 refills | Status: DC

## 2018-12-19 NOTE — Telephone Encounter (Signed)
Orders to Gretta Arab, RN for pre-cert, faxed to [facility].

## 2018-12-19 NOTE — Telephone Encounter (Signed)
-----   Message from Rip Harbour, [facility] sent at 12/19/2018  9:00 AM EST ----- Regarding: MRI MRI with contrast right ankle - evaluate soft tissue tumor right - surgical consideration

## 2018-12-19 NOTE — Progress Notes (Signed)
  Subjective:  Patient ID: Donna Gilbert, female    DOB: 1991-03-27,  MRN: [id number] HPI Chief Complaint  Patient presents with  . Foot Pain    Lateral and posterior heel bilateral - stiffness and aching x months, soft knot on right which has increased in size, tried     28 y.o. female presents with the above complaint.   ROS: Denies fever chills nausea vomiting muscle aches pains calf pain back pain chest pain shortness of breath and headache.  Past Medical History:  Diagnosis Date  . Lichen sclerosus of female genitalia   . Medical history non-contributory    Past Surgical History:  Procedure Laterality Date  . FOOT SURGERY      Current Outpatient Medications:  .  medroxyPROGESTERone (DEPO-PROVERA) 150 MG/ML injection, INJECT INTRAMUSCULARLY EVERY 3 MONTHS., Disp: , Rfl: 3 .  methylPREDNISolone (MEDROL DOSEPAK) 4 MG TBPK tablet, 6 day dose pack - take as directed, Disp: 21 tablet, Rfl: 0  Current Facility-Administered Medications:  .  0.9 %  sodium chloride infusion, 500 mL, Intravenous, Once, Nandigam, Venia Minks, MD  Allergies  Allergen Reactions  . Vicodin [Hydrocodone-Acetaminophen] Anaphylaxis   Review of Systems Objective:   Vitals:   12/19/18 0846  BP: 127/85  Pulse: 88  Resp: 16    General: Well developed, nourished, in no acute distress, alert and oriented x3   Dermatological: Skin is warm, dry and supple bilateral. Nails x 10 are well maintained; remaining integument appears unremarkable at this time. There are no open sores, no preulcerative lesions, no rash or signs of infection present.  Vascular: Dorsalis Pedis artery and Posterior Tibial artery pedal pulses are 2/4 bilateral with immedate capillary fill time. Pedal hair growth present. No varicosities and no lower extremity edema present bilateral.   Neruologic: Grossly intact via light touch bilateral. Vibratory intact via tuning fork bilateral. Protective threshold with Semmes Wienstein monofilament  intact to all pedal sites bilateral. Patellar and Achilles deep tendon reflexes 2+ bilateral. No Babinski or clonus noted bilateral.   Musculoskeletal: No gross boney pedal deformities bilateral. No pain, crepitus, or limitation noted with foot and ankle range of motion bilateral. Muscular strength 5/5 in all groups tested bilateral.  She has pes planus bilaterally painful range of motion subtalar joint right limited on inversion soft tissue mass nonpalpable but with palpation does have radiating pain up the sural distribution.  Flexible flatfoot deformity bilateral left is much more flexible than that of the right she has some soft tissue swelling and peroneal canal.  She has mild tenderness on palpation of the plantar medial aspect of the left heel.  Gait: Unassisted, Nonantalgic.    Radiographs:  Radiographs taken today demonstrate pes planus retention of the Mytec anchor to the navicular on the right foot osteoarthritic changes subtalar joint right soft tissue mass posterior lateral aspect of the right foot.  No major acute findings.  Assessment & Plan:   Assessment: Soft tissue mass/tumor posterior lateral aspect of the right heel he also has flexible pes planus bilateral lesser degree on the right.  More pain on the range of motion of the subtalar joint right due to previous arthrodesis attempt.  Plan: Discussed etiology pathology conservative surgical therapies at this point with her going to send her for a-MRI with contrast.  For better evaluation of the soft tissue mass.     Jasman Pfeifle T. [place], [place]

## 2018-12-20 ENCOUNTER — Encounter: Payer: 59 | Admitting: Nurse Practitioner

## 2018-12-23 ENCOUNTER — Other Ambulatory Visit (HOSPITAL_COMMUNITY): Payer: Self-pay | Admitting: Podiatry

## 2018-12-23 DIAGNOSIS — M7661 Achilles tendinitis, right leg: Secondary | ICD-10-CM

## 2018-12-23 DIAGNOSIS — D492 Neoplasm of unspecified behavior of bone, soft tissue, and skin: Secondary | ICD-10-CM

## 2018-12-23 DIAGNOSIS — M7662 Achilles tendinitis, left leg: Principal | ICD-10-CM

## 2019-01-03 DIAGNOSIS — Z3042 Encounter for surveillance of injectable contraceptive: Secondary | ICD-10-CM | POA: Diagnosis not present

## 2019-01-06 ENCOUNTER — Ambulatory Visit (HOSPITAL_COMMUNITY)
Admission: RE | Admit: 2019-01-06 | Discharge: 2019-01-06 | Disposition: A | Discharge status: Home / Self Care | Payer: 59 | Source: Ambulatory Visit | Attending: Podiatry | Admitting: Podiatry

## 2019-01-06 DIAGNOSIS — M7662 Achilles tendinitis, left leg: Secondary | ICD-10-CM | POA: Insufficient documentation

## 2019-01-06 DIAGNOSIS — M7661 Achilles tendinitis, right leg: Secondary | ICD-10-CM | POA: Diagnosis not present

## 2019-01-06 DIAGNOSIS — M799 Soft tissue disorder, unspecified: Secondary | ICD-10-CM | POA: Diagnosis not present

## 2019-01-06 DIAGNOSIS — R600 Localized edema: Secondary | ICD-10-CM | POA: Diagnosis not present

## 2019-01-06 DIAGNOSIS — D492 Neoplasm of unspecified behavior of bone, soft tissue, and skin: Secondary | ICD-10-CM | POA: Insufficient documentation

## 2019-01-06 MED ORDER — GADOBUTROL 1 MMOL/ML IV SOLN
10.0000 mL | Freq: Once | INTRAVENOUS | Status: AC | PRN
  Administered 2019-01-06: 10 mL via INTRAVENOUS

## 2019-01-08 ENCOUNTER — Telehealth: Payer: Self-pay | Admitting: Podiatry

## 2019-01-08 ENCOUNTER — Telehealth: Payer: Self-pay | Admitting: *Deleted

## 2019-01-08 NOTE — Telephone Encounter (Signed)
Pt called to check in on MRI results. Please call when possible if Dr had reviewed.

## 2019-01-08 NOTE — Telephone Encounter (Signed)
Answered in Result Note message left on pt's phone.

## 2019-01-08 NOTE — Telephone Encounter (Signed)
-----   Message from Garrel Ridgel, [place] sent at 01/07/2019  7:47 AM EST ----- Please send for over read. Inform patient of the delay

## 2019-01-08 NOTE — Telephone Encounter (Signed)
Faxed request for MRI disc copy to [facility].

## 2019-01-08 NOTE — Telephone Encounter (Signed)
Left message informing pt of Dr. Stephenie Acres request to send copy of MRI disc to a radiology specialist for more detailed reading for treatment planning, there would be a 10-14 day delay in results and we would call when final results were received.

## 2019-01-08 NOTE — Telephone Encounter (Signed)
I informed [place] states she will have the copy of the MRI disc sent to our office.

## 2019-01-08 NOTE — Telephone Encounter (Signed)
Donna Gilbert - [facility] states MRI was performed at [facility].

## 2019-01-16 NOTE — Telephone Encounter (Signed)
Received MRI disc and mailed to [facility].

## 2019-01-29 ENCOUNTER — Encounter: Payer: Self-pay | Admitting: Podiatry

## 2019-01-31 ENCOUNTER — Telehealth: Payer: Self-pay | Admitting: *Deleted

## 2019-01-31 DIAGNOSIS — M7662 Achilles tendinitis, left leg: Principal | ICD-10-CM

## 2019-01-31 DIAGNOSIS — D492 Neoplasm of unspecified behavior of bone, soft tissue, and skin: Secondary | ICD-10-CM

## 2019-01-31 DIAGNOSIS — M7661 Achilles tendinitis, right leg: Secondary | ICD-10-CM

## 2019-01-31 DIAGNOSIS — M7750 Other enthesopathy of unspecified foot: Secondary | ICD-10-CM

## 2019-01-31 NOTE — Telephone Encounter (Signed)
-----   Message from Garrel Ridgel, [place] sent at 01/30/2019 11:13 AM EDT ----- Os trigonum syndrome.  Have her in to discuss result.

## 2019-01-31 NOTE — Telephone Encounter (Signed)
Left message for pt to call to schedule with Dr. Milinda Pointer to discuss treatment.

## 2019-02-11 ENCOUNTER — Encounter: Payer: Self-pay | Admitting: Podiatry

## 2019-02-11 ENCOUNTER — Telehealth: Payer: Self-pay | Admitting: *Deleted

## 2019-02-11 ENCOUNTER — Other Ambulatory Visit: Payer: Self-pay

## 2019-02-11 ENCOUNTER — Ambulatory Visit (INDEPENDENT_AMBULATORY_CARE_PROVIDER_SITE_OTHER): Payer: 59 | Admitting: Podiatry

## 2019-02-11 VITALS — Temp 99.2°F

## 2019-02-11 DIAGNOSIS — M7751 Other enthesopathy of right foot: Secondary | ICD-10-CM | POA: Diagnosis not present

## 2019-02-11 DIAGNOSIS — D492 Neoplasm of unspecified behavior of bone, soft tissue, and skin: Secondary | ICD-10-CM

## 2019-02-11 NOTE — Progress Notes (Signed)
She presents today for follow-up of her MRI of her right foot.  She states that this area back here is just killing me as she refers to the lateral aspect of the right foot where there is a cyst along the lateral aspect of the Achilles the subtalar joint as well as her deep ankle.  She is also here for the MRI read.  Objective: Vital signs are stable she is alert and oriented x3.  Pulses are palpable.  Neurologic sensorium is intact deep tendon reflexes are intact muscle strength is normal symmetrical.  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  Plan: At this point after thorough discussion we injected the posterior aspect of the ankle just above the superior calcaneus at the subtalar joint for the os trigonum syndrome I injected 20 mg Kenalog 5 mg Marcaine point maximal tenderness after sterile Betadine skin prep.  Tolerated procedure well I will follow-up with her in about a month.  Hopefully at that time if surgery is indicated will be able to schedule her.

## 2019-02-11 NOTE — Telephone Encounter (Signed)
Pt states she received a injection this morning from Dr. Milinda Pointer and it is still hurting now at 4:00pm.

## 2019-02-11 NOTE — Telephone Encounter (Signed)
I called pt and informed insome cases the injection causes irritation because fluid has been injected into a small space and that may cause pressure, and in some cases there may be a steroid flare of symptoms, both can be helped with a stiff bottom wide shoe, and ice the area 3-4 times daily for 15-20 minutes protecting the skin from the ice with a light cloth and if tolerates can take OTC ibuprofen as the package instructions. Pt states understanding.

## 2019-02-11 NOTE — Telephone Encounter (Signed)
Faxed referral and demographics to [facility].

## 2019-02-11 NOTE — Telephone Encounter (Signed)
-----   Message from Rip Harbour, [facility] sent at 02/11/2019  1:20 PM EDT ----- Regarding: PT referral PT referral - [facility] in house -   Evaluate and treat - MRI in computer  Duration: 3 x week x 4 weeks

## 2019-03-11 ENCOUNTER — Ambulatory Visit: Payer: 59 | Admitting: Podiatry

## 2019-03-28 DIAGNOSIS — Z3042 Encounter for surveillance of injectable contraceptive: Secondary | ICD-10-CM | POA: Diagnosis not present

## 2019-04-03 DIAGNOSIS — Z113 Encounter for screening for infections with a predominantly sexual mode of transmission: Secondary | ICD-10-CM | POA: Diagnosis not present

## 2019-05-02 ENCOUNTER — Encounter (HOSPITAL_COMMUNITY): Payer: Self-pay

## 2019-05-02 ENCOUNTER — Ambulatory Visit (HOSPITAL_COMMUNITY)
Admission: EM | Admit: 2019-05-02 | Discharge: 2019-05-02 | Disposition: A | Discharge status: Home / Self Care | Payer: 59 | Attending: Family Medicine | Admitting: Family Medicine

## 2019-05-02 ENCOUNTER — Other Ambulatory Visit: Payer: Self-pay

## 2019-05-02 DIAGNOSIS — G43919 Migraine, unspecified, intractable, without status migrainosus: Secondary | ICD-10-CM

## 2019-05-02 MED ORDER — ONDANSETRON 4 MG PO TBDP
4.0000 mg | ORAL_TABLET | Freq: Once | ORAL | Status: AC
  Administered 2019-05-02: 09:00:00 4 mg via ORAL

## 2019-05-02 MED ORDER — ONDANSETRON 4 MG PO TBDP
ORAL_TABLET | ORAL | Status: AC
  Filled 2019-05-02: qty 1

## 2019-05-02 MED ORDER — KETOROLAC TROMETHAMINE 60 MG/2ML IM SOLN
60.0000 mg | Freq: Once | INTRAMUSCULAR | Status: AC
  Administered 2019-05-02: 60 mg via INTRAMUSCULAR

## 2019-05-02 MED ORDER — ONDANSETRON HCL 4 MG PO TABS: 4.0000 mg | ORAL_TABLET | Freq: Four times a day (QID) | ORAL | 0 refills | Status: AC

## 2019-05-02 MED ORDER — BUTALBITAL-APAP-CAFFEINE 50-325-40 MG PO TABS: 1.0000 | ORAL_TABLET | Freq: Four times a day (QID) | ORAL | 0 refills | Status: AC | PRN

## 2019-05-02 MED ORDER — KETOROLAC TROMETHAMINE 60 MG/2ML IM SOLN
INTRAMUSCULAR | Status: AC
  Filled 2019-05-02: qty 2

## 2019-05-02 NOTE — Discharge Instructions (Signed)
Home to rest.  It is important you try to sleep Take Fioricet as needed for headache Take Zofran as needed for nausea and vomiting Make sure you are drinking enough fluids even if your appetite is poor Follow-up with your primary care doctor for ongoing headache management

## 2019-05-02 NOTE — ED Provider Notes (Signed)
[name]    CSN: [id number] Arrival date & time: 05/02/19  0805     History   Chief Complaint Chief Complaint  Patient presents with  . Migraine    HPI Donna Gilbert is a 28 y.o. female.   HPI  Patient is here for a migraine.  She states that she has these occasionally.  This current migraine is been going on for over a week.  It settled above her left eye.  No visual changes.  No neuro symptoms, problems with arms or legs, problems with balance or thinking.  She states that she has nausea.  She vomited this morning.  She has irregular sleep patterns because working 12-hour shifts at night.  She is tried over-the-counter medicines.  These have not helped. No sinus infection, postnasal drip, allergies No head trauma or head injury Feels like a typical migraine, a little worse  Past Medical History:  Diagnosis Date  . Lichen sclerosus of female genitalia   . Medical history non-contributory     Patient Active Problem List   Diagnosis Date Noted  . Chronic pain of both knees 09/17/2018  . Heartburn 04/09/2018  . Nausea without vomiting 04/09/2018  . Early satiety 04/09/2018  . Loss of weight 04/09/2018    Past Surgical History:  Procedure Laterality Date  . FOOT SURGERY      OB History    Gravida  2   Para  1   Term  1   Preterm      AB  1   Living  1     SAB  1   TAB      Ectopic      Multiple      Live Births  1            Home Medications    Prior to Admission medications   Medication Sig Start Date End Date Taking? Authorizing Provider  medroxyPROGESTERone (DEPO-PROVERA) 150 MG/ML injection INJECT INTRAMUSCULARLY EVERY 3 MONTHS. 09/05/17  Yes [provider]  butalbital-acetaminophen-caffeine (FIORICET) 50-325-40 MG tablet Take 1-2 tablets by mouth every 6 (six) hours as needed for headache. 05/02/19 05/01/20  Raylene Everts, MD  ondansetron (ZOFRAN) 4 MG tablet Take 1 tablet (4 mg total) by mouth every 6 (six)  hours. 05/02/19   Raylene Everts, MD    Family History Family History  Problem Relation Age of Onset  . Breast cancer Paternal Grandmother   . Pancreatic cancer Maternal Grandfather   . Diabetes Maternal Grandfather   . Diabetes Mother   . Diabetes Maternal Grandmother     Social History Social History   Tobacco Use  . Smoking status: Current Every Day Smoker    Packs/day: 0.50    Types: Cigarettes  . Smokeless tobacco: Never Used  Substance Use Topics  . Alcohol use: Yes    Comment: occasioinally   . Drug use: No     Allergies   Vicodin [hydrocodone-acetaminophen]   Review of Systems Review of Systems  Constitutional: Negative for chills and fever.  HENT: Negative for ear pain and sore throat.   Eyes: Positive for photophobia. Negative for pain and visual disturbance.  Respiratory: Negative for cough and shortness of breath.   Cardiovascular: Negative for chest pain and palpitations.  Gastrointestinal: Positive for nausea and vomiting. Negative for abdominal pain.  Genitourinary: Negative for dysuria and hematuria.  Musculoskeletal: Negative for arthralgias, back pain and gait problem.  Skin: Negative for color change and rash.  Neurological:  Positive for headaches. Negative for dizziness, seizures, syncope, weakness and light-headedness.  All other systems reviewed and are negative.    Physical Exam Triage Vital Signs ED Triage Vitals  Enc Vitals Group     BP 05/02/19 0835 127/89     Pulse Rate 05/02/19 0831 88     Resp 05/02/19 0831 18     Temp 05/02/19 0831 98.5 F (36.9 C)     Temp Source 05/02/19 0831 Oral     SpO2 05/02/19 0831 99 %     Weight --      Height --      Head Circumference --      Peak Flow --      Pain Score 05/02/19 0829 2     Pain Loc --      Pain Edu? --      Excl. in [place]? --    No data found.  Updated Vital Signs BP 127/89   Pulse 88   Temp 98.5 F (36.9 C) (Oral)   Resp 18   SpO2 99%       Physical Exam  Constitutional:      General: She is not in acute distress.    Appearance: She is well-developed. She is obese.     Comments: Appears uncomfortable  HENT:     Head: Normocephalic and atraumatic.     Right Ear: Tympanic membrane and ear canal normal.     Left Ear: Tympanic membrane and ear canal normal.     Nose: Nose normal.     Mouth/Throat:     Pharynx: No posterior oropharyngeal erythema.  Eyes:     Conjunctiva/sclera: Conjunctivae normal.     Pupils: Pupils are equal, round, and reactive to light.  Neck:     Musculoskeletal: Normal range of motion and neck supple.  Cardiovascular:     Rate and Rhythm: Normal rate and regular rhythm.     Heart sounds: Normal heart sounds.  Pulmonary:     Effort: Pulmonary effort is normal. No respiratory distress.     Breath sounds: Normal breath sounds.  Abdominal:     General: There is no distension.     Palpations: Abdomen is soft.  Musculoskeletal: Normal range of motion.  Skin:    General: Skin is warm and dry.  Neurological:     Mental Status: She is alert.     Cranial Nerves: No cranial nerve deficit.     Sensory: No sensory deficit.     Motor: No weakness.     Coordination: Coordination normal.     Gait: Gait normal.     Deep Tendon Reflexes: Reflexes normal.  Psychiatric:        Mood and Affect: Mood normal.        Behavior: Behavior normal.   Normal neurologic exam   UC Treatments / Results  Labs (all labs ordered are listed, but only abnormal results are displayed) Labs Reviewed - No data to display  EKG None  Radiology No results found.  Procedures Procedures (including critical care time)  Medications Ordered in UC Medications  ondansetron (ZOFRAN-ODT) disintegrating tablet 4 mg (4 mg Oral Given 05/02/19 0908)  ketorolac (TORADOL) injection 60 mg (60 mg Intramuscular Given 05/02/19 0908)  ondansetron (ZOFRAN-ODT) 4 MG disintegrating tablet (has no administration in time range)  ketorolac (TORADOL) 60 MG/2ML  injection (has no administration in time range)    Initial Impression / Assessment and Plan / UC Course  I have reviewed the triage vital  signs and the nursing notes.  Pertinent labs & imaging results that were available during my care of the patient were reviewed by me and considered in my medical decision making (see chart for details).    Patient is given prescriptions to take at home for her migraine.  Note for work.  Instructions on follow-up, portance of PCP Final Clinical Impressions(s) / UC Diagnoses   Final diagnoses:  Intractable migraine without status migrainosus, unspecified migraine type     Discharge Instructions     Home to rest.  It is important you try to sleep Take Fioricet as needed for headache Take Zofran as needed for nausea and vomiting Make sure you are drinking enough fluids even if your appetite is poor Follow-up with your primary care doctor for ongoing headache management    ED Prescriptions    Medication Sig Dispense Auth. Provider   ondansetron (ZOFRAN) 4 MG tablet Take 1 tablet (4 mg total) by mouth every 6 (six) hours. 12 tablet Raylene Everts, MD   butalbital-acetaminophen-caffeine (FIORICET) [phone] MG tablet Take 1-2 tablets by mouth every 6 (six) hours as needed for headache. 20 tablet Raylene Everts, MD     Controlled Substance Prescriptions [place] Controlled Substance Registry consulted?Yes   Raylene Everts, MD 05/02/19 2011

## 2019-05-02 NOTE — ED Triage Notes (Signed)
Patient presents to Urgent Care with complaints of intermittent migraine headache since the past few days. Patient reports she has been taking otc headache relief wtihout lasting improvement, pt had one episode of vomiting this morning due to headache.

## 2019-05-20 DIAGNOSIS — L900 Lichen sclerosus et atrophicus: Secondary | ICD-10-CM | POA: Diagnosis not present

## 2019-05-20 DIAGNOSIS — Z113 Encounter for screening for infections with a predominantly sexual mode of transmission: Secondary | ICD-10-CM | POA: Diagnosis not present

## 2019-05-20 DIAGNOSIS — N939 Abnormal uterine and vaginal bleeding, unspecified: Secondary | ICD-10-CM | POA: Diagnosis not present

## 2019-05-20 DIAGNOSIS — Z309 Encounter for contraceptive management, unspecified: Secondary | ICD-10-CM | POA: Diagnosis not present

## 2019-05-29 DIAGNOSIS — Z3202 Encounter for pregnancy test, result negative: Secondary | ICD-10-CM | POA: Diagnosis not present

## 2019-05-29 DIAGNOSIS — Z30430 Encounter for insertion of intrauterine contraceptive device: Secondary | ICD-10-CM | POA: Diagnosis not present

## 2019-08-29 ENCOUNTER — Emergency Department (HOSPITAL_COMMUNITY)
Admission: EM | Admit: 2019-08-29 | Discharge: 2019-08-29 | Discharge status: Against Medical Advice | Payer: 59 | Attending: Emergency Medicine | Admitting: Emergency Medicine

## 2019-08-29 ENCOUNTER — Encounter (HOSPITAL_COMMUNITY): Payer: Self-pay

## 2019-08-29 ENCOUNTER — Other Ambulatory Visit: Payer: Self-pay

## 2019-08-29 DIAGNOSIS — R6884 Jaw pain: Secondary | ICD-10-CM | POA: Insufficient documentation

## 2019-08-29 DIAGNOSIS — Z5321 Procedure and treatment not carried out due to patient leaving prior to being seen by health care provider: Secondary | ICD-10-CM | POA: Insufficient documentation

## 2019-08-29 NOTE — ED Triage Notes (Signed)
Pt coming from home c/o pain at jaw with pressure and hurts to open mouth. Pain also behind left ear that is constant and throbbing and worse when turning neck. Pt stated she can feel lumps behind ear. No blurry vision, no N/V/D, no loss of taste or smell.

## 2019-08-29 NOTE — ED Notes (Signed)
Pt called for room, no response from the lobby

## 2019-09-08 ENCOUNTER — Other Ambulatory Visit: Payer: Self-pay

## 2019-09-08 DIAGNOSIS — Z20822 Contact with and (suspected) exposure to covid-19: Secondary | ICD-10-CM

## 2019-09-09 LAB — NOVEL CORONAVIRUS, NAA: SARS-CoV-2, NAA: NOT DETECTED

## 2019-09-15 ENCOUNTER — Ambulatory Visit (HOSPITAL_COMMUNITY)
Admission: EM | Admit: 2019-09-15 | Discharge: 2019-09-15 | Disposition: A | Discharge status: Home / Self Care | Payer: Self-pay | Attending: Family Medicine | Admitting: Family Medicine

## 2019-09-15 ENCOUNTER — Other Ambulatory Visit: Payer: Self-pay

## 2019-09-15 ENCOUNTER — Encounter (HOSPITAL_COMMUNITY): Payer: Self-pay

## 2019-09-15 DIAGNOSIS — Z3202 Encounter for pregnancy test, result negative: Secondary | ICD-10-CM

## 2019-09-15 DIAGNOSIS — R300 Dysuria: Secondary | ICD-10-CM

## 2019-09-15 DIAGNOSIS — N3001 Acute cystitis with hematuria: Secondary | ICD-10-CM

## 2019-09-15 LAB — POCT URINALYSIS DIP (DEVICE)
Bilirubin Urine: NEGATIVE
Glucose, UA: NEGATIVE mg/dL
Ketones, ur: NEGATIVE mg/dL
Nitrite: NEGATIVE
Protein, ur: NEGATIVE mg/dL
Specific Gravity, Urine: 1.025 (ref 1.005–1.030)
Urobilinogen, UA: 1 mg/dL (ref 0.0–1.0)
pH: 7 (ref 5.0–8.0)

## 2019-09-15 LAB — POCT PREGNANCY, URINE: Preg Test, Ur: NEGATIVE

## 2019-09-15 MED ORDER — NITROFURANTOIN MONOHYD MACRO 100 MG PO CAPS: 100.0000 mg | ORAL_CAPSULE | Freq: Two times a day (BID) | ORAL | 0 refills | Status: AC

## 2019-09-15 NOTE — Discharge Instructions (Signed)
Urine showed evidence of infection. We are treating you with macrobid- twice daily for 5 days. Be sure to take full course. Stay hydrated- urine should be pale yellow to clear. May continue azo for relief of burning while infection is being cleared.   Please return or follow up with your primary provider if symptoms not improving with treatment. Please return sooner if you have worsening of symptoms or develop fever, nausea, vomiting, abdominal pain, back pain, lightheadedness, dizziness. 

## 2019-09-15 NOTE — ED Triage Notes (Signed)
Pt presents to UC w/ c/o frequent urination, dysuria, hematuria, foul odor of urine x3 days.

## 2019-09-17 NOTE — ED Provider Notes (Signed)
[facility]    CSN: [id number] Arrival date & time: 09/15/19  1910      History   Chief Complaint Chief Complaint  Patient presents with  . uti sx    HPI Donna Gilbert is a 28 y.o. female no significant past medical history presenting today for evaluation of possible UTI.  Patient states that over the past 3 days she has noticed urinary odor, frequency, dysuria and hematuria.  Symptoms have progressed over the past few days.  She has had some lower abdominal pressure.  Denies nausea or vomiting.  Denies fevers.  Does not have regular menstrual cycles as she is currently using Mirena for birth control.  HPI  Past Medical History:  Diagnosis Date  . Lichen sclerosus of female genitalia   . Medical history non-contributory     Patient Active Problem List   Diagnosis Date Noted  . Chronic pain of both knees 09/17/2018  . Heartburn 04/09/2018  . Nausea without vomiting 04/09/2018  . Early satiety 04/09/2018  . Loss of weight 04/09/2018    Past Surgical History:  Procedure Laterality Date  . FOOT SURGERY      OB History    Gravida  2   Para  1   Term  1   Preterm      AB  1   Living  1     SAB  1   TAB      Ectopic      Multiple      Live Births  1            Home Medications    Prior to Admission medications   Medication Sig Start Date End Date Taking? Authorizing Provider  butalbital-acetaminophen-caffeine (FIORICET) [phone] MG tablet Take 1-2 tablets by mouth every 6 (six) hours as needed for headache. 05/02/19 05/01/20  Raylene Everts, MD  medroxyPROGESTERone (DEPO-PROVERA) 150 MG/ML injection INJECT INTRAMUSCULARLY EVERY 3 MONTHS. 09/05/17   [provider]  nitrofurantoin, macrocrystal-monohydrate, (MACROBID) 100 MG capsule Take 1 capsule (100 mg total) by mouth 2 (two) times daily for 5 days. 09/15/19 09/20/19  Wieters, Hallie C, PA-C  ondansetron (ZOFRAN) 4 MG tablet Take 1 tablet (4 mg total) by mouth every 6  (six) hours. 05/02/19   Raylene Everts, MD    Family History Family History  Problem Relation Age of Onset  . Breast cancer Paternal Grandmother   . Pancreatic cancer Maternal Grandfather   . Diabetes Maternal Grandfather   . Diabetes Mother   . Diabetes Maternal Grandmother     Social History Social History   Tobacco Use  . Smoking status: Current Every Day Smoker    Packs/day: 0.50    Types: Cigarettes  . Smokeless tobacco: Never Used  Substance Use Topics  . Alcohol use: Yes    Comment: occasioinally   . Drug use: No     Allergies   Vicodin [hydrocodone-acetaminophen]   Review of Systems Review of Systems  Constitutional: Negative for fever.  Respiratory: Negative for shortness of breath.   Cardiovascular: Negative for chest pain.  Gastrointestinal: Negative for abdominal pain, diarrhea, nausea and vomiting.  Genitourinary: Positive for dysuria, frequency and hematuria. Negative for flank pain, genital sores, menstrual problem, vaginal bleeding, vaginal discharge and vaginal pain.  Musculoskeletal: Negative for back pain.  Skin: Negative for rash.  Neurological: Negative for dizziness, light-headedness and headaches.     Physical Exam Triage Vital Signs ED Triage Vitals  Enc Vitals Group  BP 09/15/19 2003 130/88     Pulse Rate 09/15/19 2003 94     Resp 09/15/19 2003 16     Temp --      Temp src --      SpO2 09/15/19 2003 95 %     Weight --      Height --      Head Circumference --      Peak Flow --      Pain Score 09/15/19 2005 5     Pain Loc --      Pain Edu? --      Excl. in [place]? --    No data found.  Updated Vital Signs BP 130/88 (BP Location: Left Arm)   Pulse 94   Resp 16   SpO2 95%   Visual Acuity Right Eye Distance:   Left Eye Distance:   Bilateral Distance:    Right Eye Near:   Left Eye Near:    Bilateral Near:     Physical Exam Vitals signs and nursing note reviewed.  Constitutional:      Appearance: She is  well-developed.     Comments: No acute distress  HENT:     Head: Normocephalic and atraumatic.     Nose: Nose normal.  Eyes:     Conjunctiva/sclera: Conjunctivae normal.  Neck:     Musculoskeletal: Neck supple.  Cardiovascular:     Rate and Rhythm: Normal rate.  Pulmonary:     Effort: Pulmonary effort is normal. No respiratory distress.  Abdominal:     General: There is no distension.     Comments: Soft, nondistended, nontender to light and deep palpation throughout abdomen, no CVA tenderness  Musculoskeletal: Normal range of motion.  Skin:    General: Skin is warm and dry.  Neurological:     Mental Status: She is alert and oriented to person, place, and time.      UC Treatments / Results  Labs (all labs ordered are listed, but only abnormal results are displayed) Labs Reviewed  URINE CULTURE - Abnormal; Notable for the following components:      Result Value   Culture >=100,000 COLONIES/mL ESCHERICHIA COLI (*)    All other components within normal limits  POCT URINALYSIS DIP (DEVICE) - Abnormal; Notable for the following components:   Hgb urine dipstick LARGE (*)    Leukocytes,Ua MODERATE (*)    All other components within normal limits  POC URINE PREG, ED  POCT PREGNANCY, URINE    EKG   Radiology No results found.  Procedures Procedures (including critical care time)  Medications Ordered in UC Medications - No data to display  Initial Impression / Assessment and Plan / UC Course  I have reviewed the triage vital signs and the nursing notes.  Pertinent labs & imaging results that were available during my care of the patient were reviewed by me and considered in my medical decision making (see chart for details).     Moderate leuks on UA, will treat for UTI.  Will provide Macrobid, twice daily x5 days.  Will send for culture to confirm as well as check since it radiates.  Will call if needing to alter antibiotic therapy.  Push fluids.  Do not suspect  pyelonephritis at this time.  Continue to monitor,Discussed strict return precautions. Patient verbalized understanding and is agreeable with plan.  Final Clinical Impressions(s) / UC Diagnoses   Final diagnoses:  Acute cystitis with hematuria     Discharge Instructions  Urine showed evidence of infection. We are treating you with macrobid- twice daily for 5 days. Be sure to take full course. Stay hydrated- urine should be pale yellow to clear. May continue azo for relief of burning while infection is being cleared.   Please return or follow up with your primary provider if symptoms not improving with treatment. Please return sooner if you have worsening of symptoms or develop fever, nausea, vomiting, abdominal pain, back pain, lightheadedness, dizziness.   ED Prescriptions    Medication Sig Dispense Auth. Provider   nitrofurantoin, macrocrystal-monohydrate, (MACROBID) 100 MG capsule Take 1 capsule (100 mg total) by mouth 2 (two) times daily for 5 days. 10 capsule Wieters, [place] C, PA-C     PDMP not reviewed this encounter.   Janith Lima, [place] 09/17/19 [id number]

## 2019-09-18 ENCOUNTER — Telehealth (HOSPITAL_COMMUNITY): Payer: Self-pay | Admitting: Emergency Medicine

## 2019-09-18 LAB — URINE CULTURE: Culture: >=100000 — AB

## 2019-09-18 NOTE — Telephone Encounter (Signed)
Urine culture was positive for e coli and was given  macrobid at urgent care visit. Pt contacted and made aware, educated on completing antibiotic and to follow up if symptoms are persistent. Verbalized understanding.    

## 2020-03-10 ENCOUNTER — Other Ambulatory Visit: Payer: Self-pay | Admitting: Obstetrics and Gynecology

## 2020-03-10 DIAGNOSIS — N644 Mastodynia: Secondary | ICD-10-CM

## 2020-03-10 DIAGNOSIS — N6452 Nipple discharge: Secondary | ICD-10-CM

## 2020-03-15 ENCOUNTER — Other Ambulatory Visit: Payer: Self-pay

## 2020-03-26 ENCOUNTER — Other Ambulatory Visit: Payer: Self-pay

## 2020-05-24 ENCOUNTER — Ambulatory Visit (HOSPITAL_COMMUNITY)
Admission: EM | Admit: 2020-05-24 | Discharge: 2020-05-24 | Disposition: A | Discharge status: Home / Self Care | Payer: Managed Care, Other (non HMO) | Attending: Family Medicine | Admitting: Family Medicine

## 2020-05-24 ENCOUNTER — Encounter (HOSPITAL_COMMUNITY): Payer: Self-pay

## 2020-05-24 ENCOUNTER — Ambulatory Visit (INDEPENDENT_AMBULATORY_CARE_PROVIDER_SITE_OTHER): Payer: Managed Care, Other (non HMO)

## 2020-05-24 ENCOUNTER — Other Ambulatory Visit: Payer: Self-pay

## 2020-05-24 DIAGNOSIS — S40012A Contusion of left shoulder, initial encounter: Secondary | ICD-10-CM

## 2020-05-24 DIAGNOSIS — R102 Pelvic and perineal pain: Secondary | ICD-10-CM

## 2020-05-24 DIAGNOSIS — M545 Low back pain: Secondary | ICD-10-CM | POA: Diagnosis not present

## 2020-05-24 DIAGNOSIS — S300XXA Contusion of lower back and pelvis, initial encounter: Secondary | ICD-10-CM

## 2020-05-24 MED ORDER — NAPROXEN 500 MG PO TABS: 500.0000 mg | ORAL_TABLET | Freq: Two times a day (BID) | ORAL | 0 refills | Status: AC

## 2020-05-24 MED ORDER — TRAMADOL HCL 50 MG PO TABS: 50.0000 mg | ORAL_TABLET | Freq: Four times a day (QID) | ORAL | 0 refills | Status: AC | PRN

## 2020-05-24 MED ORDER — KETOROLAC TROMETHAMINE 60 MG/2ML IM SOLN
INTRAMUSCULAR | Status: AC
  Filled 2020-05-24: qty 2

## 2020-05-24 MED ORDER — KETOROLAC TROMETHAMINE 60 MG/2ML IM SOLN
60.0000 mg | Freq: Once | INTRAMUSCULAR | Status: AC
  Administered 2020-05-24: 60 mg via INTRAMUSCULAR

## 2020-05-24 NOTE — ED Triage Notes (Signed)
Pt states she was riding a horse and it took off and she fell off. Pt states she hit her head, lower mid back pain. Pt states she has a scratch and bruising on her buttocks and bruising on her left shoulder. Pt denies LOC. Pt denies blood thinners. Pt states it hurts to sit.

## 2020-05-24 NOTE — Discharge Instructions (Signed)
Your xrays are normal today which is reassuring.  Obviously have bruising from your fall striking rocks.  Ice the area.  Use of a donut pillow may be helpful.  Naproxen twice a day, take with food.  Tramadol for breakthrough pain. May cause drowsiness. Please do not take if driving or drinking alcohol.   May take upwards of a few weeks for this to completely resolve, if worsening please return.  Light and regular activity. As tolerated

## 2020-05-24 NOTE — ED Provider Notes (Signed)
Donna Gilbert    CSN: [id number] Arrival date & time: 05/24/20  1232      History   Chief Complaint Chief Complaint  Patient presents with   hit head and back    HPI Donna Gilbert is a 29 y.o. female.   Donna Gilbert presents with complaints of low back, coccyx and buttocks pain following a fall off a horse two days ago. She was in [place] on a horseback tour and the horse she was on started galloping. She fell off, onto rocks. Hit the back of her head, didn't lose consciousness. She was able to get up and start walking but noted pain immediately and has had pain since. Pain is primarily to tailbone and low back. Bruising scattered to right knee, left shoulder, buttocks. No numbness tingling or weakness. No loss of bladder or bowel. No neck pain. Denies any previous injuries to the affected areas. She has been ambulatory. Pain to tailbone with sitting. Hasn't taken any medications today for pain. Had been taking an antiinflammatory from [place] which hadn't helped.    ROS per HPI, negative if not otherwise mentioned.      Past Medical History:  Diagnosis Date   Lichen sclerosus of female genitalia    Medical history non-contributory     Patient Active Problem List   Diagnosis Date Noted   Chronic pain of both knees 09/17/2018   Heartburn 04/09/2018   Nausea without vomiting 04/09/2018   Early satiety 04/09/2018   Loss of weight 04/09/2018    Past Surgical History:  Procedure Laterality Date   FOOT SURGERY      OB History    Gravida  2   Para  1   Term  1   Preterm      AB  1   Living  1     SAB  1   TAB      Ectopic      Multiple      Live Births  1            Home Medications    Prior to Admission medications   Medication Sig Start Date End Date Taking? Authorizing Provider  medroxyPROGESTERone (DEPO-PROVERA) 150 MG/ML injection INJECT INTRAMUSCULARLY EVERY 3 MONTHS. 09/05/17   [provider]  naproxen  (NAPROSYN) 500 MG tablet Take 1 tablet (500 mg total) by mouth 2 (two) times daily. 05/24/20   Zigmund Gottron, NP  ondansetron (ZOFRAN) 4 MG tablet Take 1 tablet (4 mg total) by mouth every 6 (six) hours. 05/02/19   Raylene Everts, MD  traMADol (ULTRAM) 50 MG tablet Take 1 tablet (50 mg total) by mouth every 6 (six) hours as needed. 05/24/20   Zigmund Gottron, NP    Family History Family History  Problem Relation Age of Onset   Breast cancer Paternal Grandmother    Pancreatic cancer Maternal Grandfather    Diabetes Maternal Grandfather    Diabetes Mother    Diabetes Maternal Grandmother     Social History Social History   Tobacco Use   Smoking status: Current Every Day Smoker    Packs/day: 0.50    Types: Cigarettes   Smokeless tobacco: Never Used  Vaping Use   Vaping Use: Never used  Substance Use Topics   Alcohol use: Yes    Comment: occasioinally    Drug use: No     Allergies   Vicodin [hydrocodone-acetaminophen]   Review of Systems Review of Systems   Physical Exam Triage  Vital Signs ED Triage Vitals  Enc Vitals Group     BP 05/24/20 1259 135/71     Pulse Rate 05/24/20 1259 91     Resp 05/24/20 1259 18     Temp 05/24/20 1259 98.6 F (37 C)     Temp Source 05/24/20 1259 Oral     SpO2 05/24/20 1259 100 %     Weight 05/24/20 1303 250 lb (113.4 kg)     Height 05/24/20 1303 5\' 7"  (1.702 m)     Head Circumference --      Peak Flow --      Pain Score 05/24/20 1303 7     Pain Loc --      Pain Edu? --      Excl. in [place]? --    No data found.  Updated Vital Signs BP 135/71    Pulse 91    Temp 98.6 F (37 C) (Oral)    Resp 18    Ht 5\' 7"  (1.702 m)    Wt 250 lb (113.4 kg)    SpO2 100%    BMI 39.16 kg/m   Visual Acuity Right Eye Distance:   Left Eye Distance:   Bilateral Distance:    Right Eye Near:   Left Eye Near:    Bilateral Near:     Physical Exam Constitutional:      General: She is not in acute distress.    Appearance: She is  well-developed.  Cardiovascular:     Rate and Rhythm: Normal rate.  Pulmonary:     Effort: Pulmonary effort is normal.  Musculoskeletal:       Arms:       Back:       Legs:     Comments: Bruising noted to left posterior shoulder, right knee, left buttocks, coccyx, with associated tenderness; strength equal bilaterally; gross sensation intact to lower and upper extremities; ambulatory without difficulty   Skin:    General: Skin is warm and dry.  Neurological:     Mental Status: She is alert and oriented to person, place, and time.      UC Treatments / Results  Labs (all labs ordered are listed, but only abnormal results are displayed) Labs Reviewed - No data to display  EKG   Radiology DG Lumbar Spine Complete  Result Date: 05/24/2020 CLINICAL DATA:  Recent fall from horse with low back pain, initial encounter EXAM: LUMBAR SPINE - COMPLETE 4+ VIEW COMPARISON:  None. FINDINGS: Vertebral body height is well maintained. No pars defects are noted. No anterolisthesis is seen. Minimal facet hypertrophic changes are noted in the lower lumbar spine. No soft tissue abnormality is seen. IMPRESSION: No acute abnormality noted. Electronically Signed   By: Inez Catalina M.D.   On: 05/24/2020 15:27   DG Pelvis 1-2 Views  Result Date: 05/24/2020 CLINICAL DATA:  Fall from horse 2 days ago with pelvic pain, initial encounter EXAM: PELVIS - 2 VIEW COMPARISON:  None. FINDINGS: Pelvic ring appears intact. Sacral ala are intact. No fracture is seen. No dislocation is noted. No soft tissue abnormality is seen. IMPRESSION: No acute abnormality noted. Electronically Signed   By: Inez Catalina M.D.   On: 05/24/2020 15:26    Procedures Procedures (including critical care time)  Medications Ordered in UC Medications  ketorolac (TORADOL) injection 60 mg (60 mg Intramuscular Given 05/24/20 1423)    Initial Impression / Assessment and Plan / UC Course  I have reviewed the triage vital signs and the  nursing notes.  Pertinent labs & imaging results that were available during my care of the patient were reviewed by me and considered in my medical decision making (see chart for details).     No red flag findings. Images without acute findings. Consistent with contusions. toradol in clinic helped with pain. Pain management discussed. Return precautions provided. Patient verbalized understanding and agreeable to plan.  Ambulatory out of clinic without difficulty.    Final Clinical Impressions(s) / UC Diagnoses   Final diagnoses:  Contusion of coccyx, initial encounter  Contusion of left shoulder, initial encounter  Fall from horse, initial encounter     Discharge Instructions     Your xrays are normal today which is reassuring.  Obviously have bruising from your fall striking rocks.  Ice the area.  Use of a donut pillow may be helpful.  Naproxen twice a day, take with food.  Tramadol for breakthrough pain. May cause drowsiness. Please do not take if driving or drinking alcohol.   May take upwards of a few weeks for this to completely resolve, if worsening please return.  Light and regular activity. As tolerated    ED Prescriptions    Medication Sig Dispense Auth. Provider   naproxen (NAPROSYN) 500 MG tablet Take 1 tablet (500 mg total) by mouth 2 (two) times daily. 30 tablet Augusto Gamble B, NP   traMADol (ULTRAM) 50 MG tablet Take 1 tablet (50 mg total) by mouth every 6 (six) hours as needed. 15 tablet Augusto Gamble B, NP     I have reviewed the PDMP during this encounter.   Zigmund Gottron, NP 05/24/20 1609

## 2020-06-21 ENCOUNTER — Telehealth: Payer: Self-pay | Admitting: General Practice

## 2020-06-21 NOTE — Telephone Encounter (Signed)
Patient was calling to schedule an appt with Zelda for depression. Advised that there were no available appts. Patient has not be seen since 2019 Please advise CB- [phone]

## 2020-06-22 NOTE — Telephone Encounter (Signed)
Called pt she states she wants seen before September. Pt  request office call her if we have any cancellations. Pt chose not to schedule an appointment in Sept. at this time.

## 2020-08-02 ENCOUNTER — Ambulatory Visit: Payer: Medicaid Other | Attending: Nurse Practitioner | Admitting: Nurse Practitioner

## 2020-09-15 ENCOUNTER — Other Ambulatory Visit: Payer: Medicaid Other

## 2021-03-05 IMAGING — DX DG PELVIS 1-2V
2 series · 2 of 2 positions shown · non-contrast
Comparison: None.

CLINICAL DATA: Fall from horse 2 days ago with pelvic pain, initial
encounter

EXAM:
PELVIS - 2 VIEW

[pelvis ap]
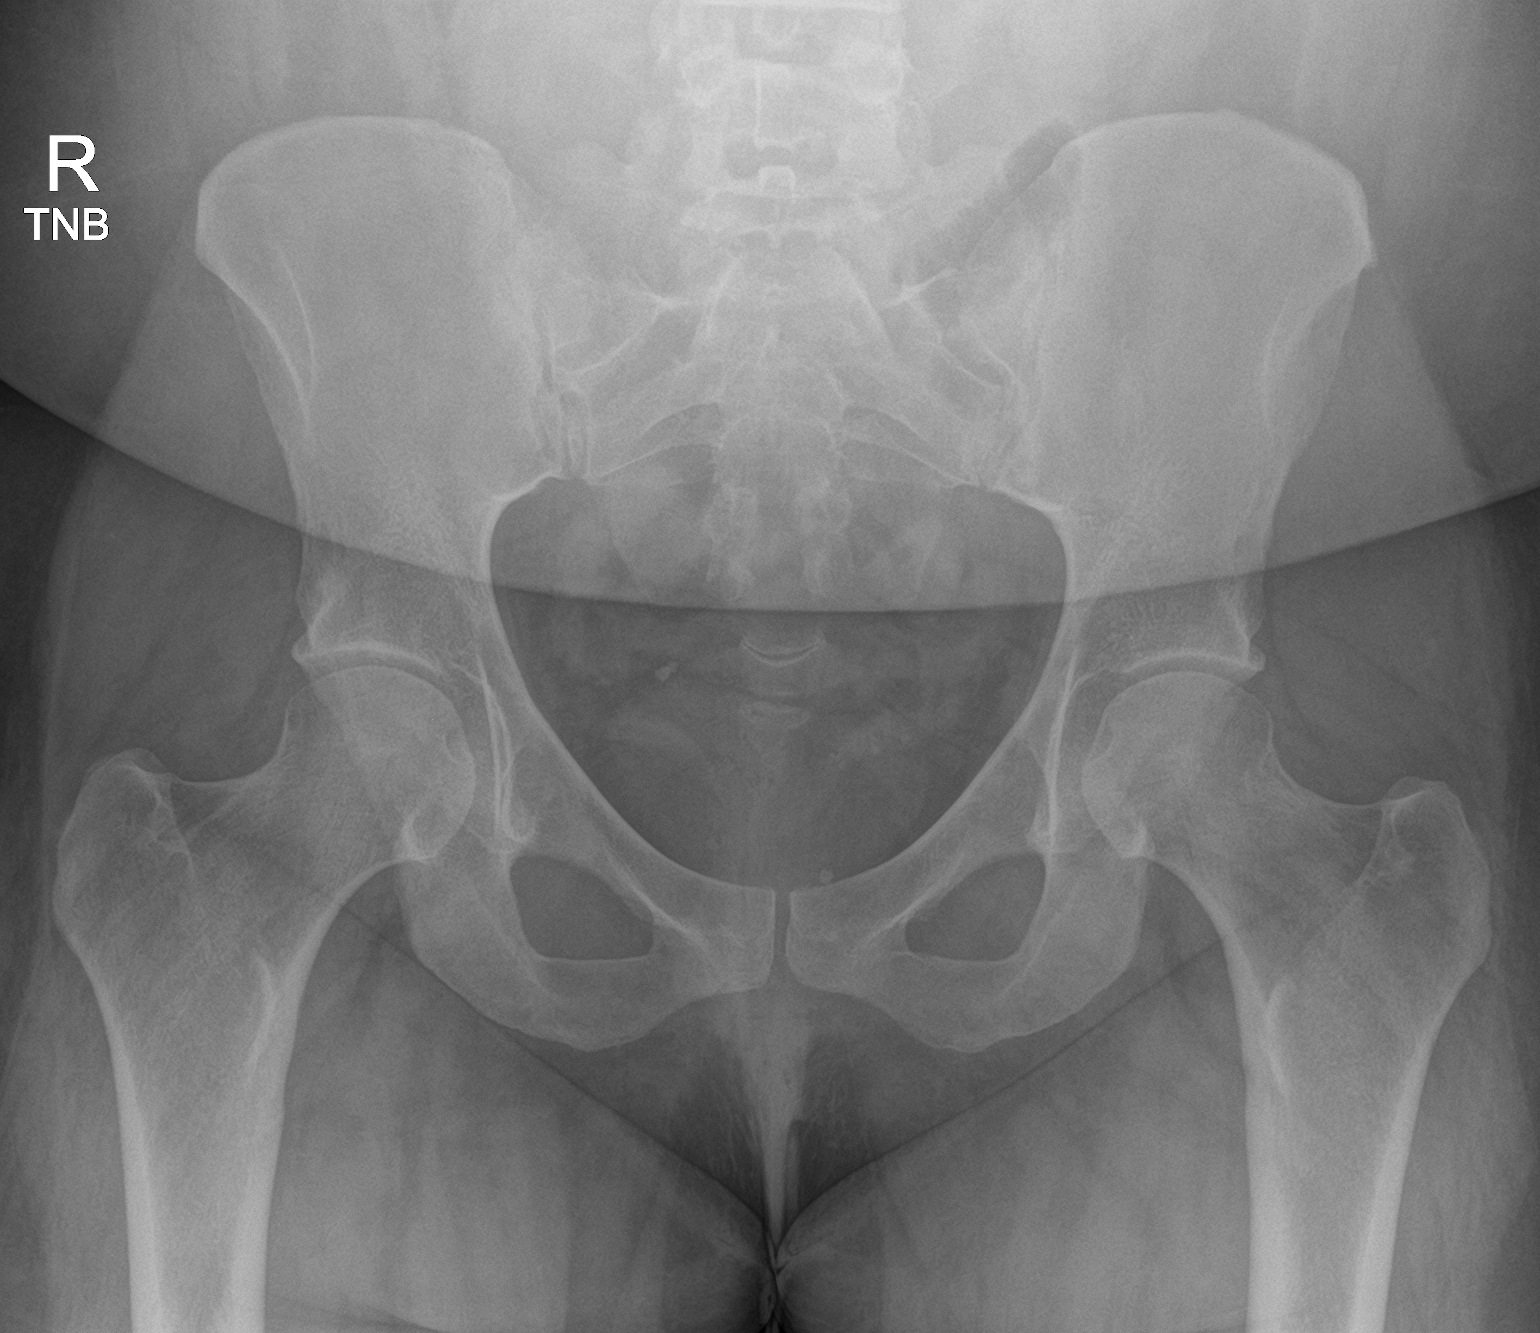

[pelvis lat]
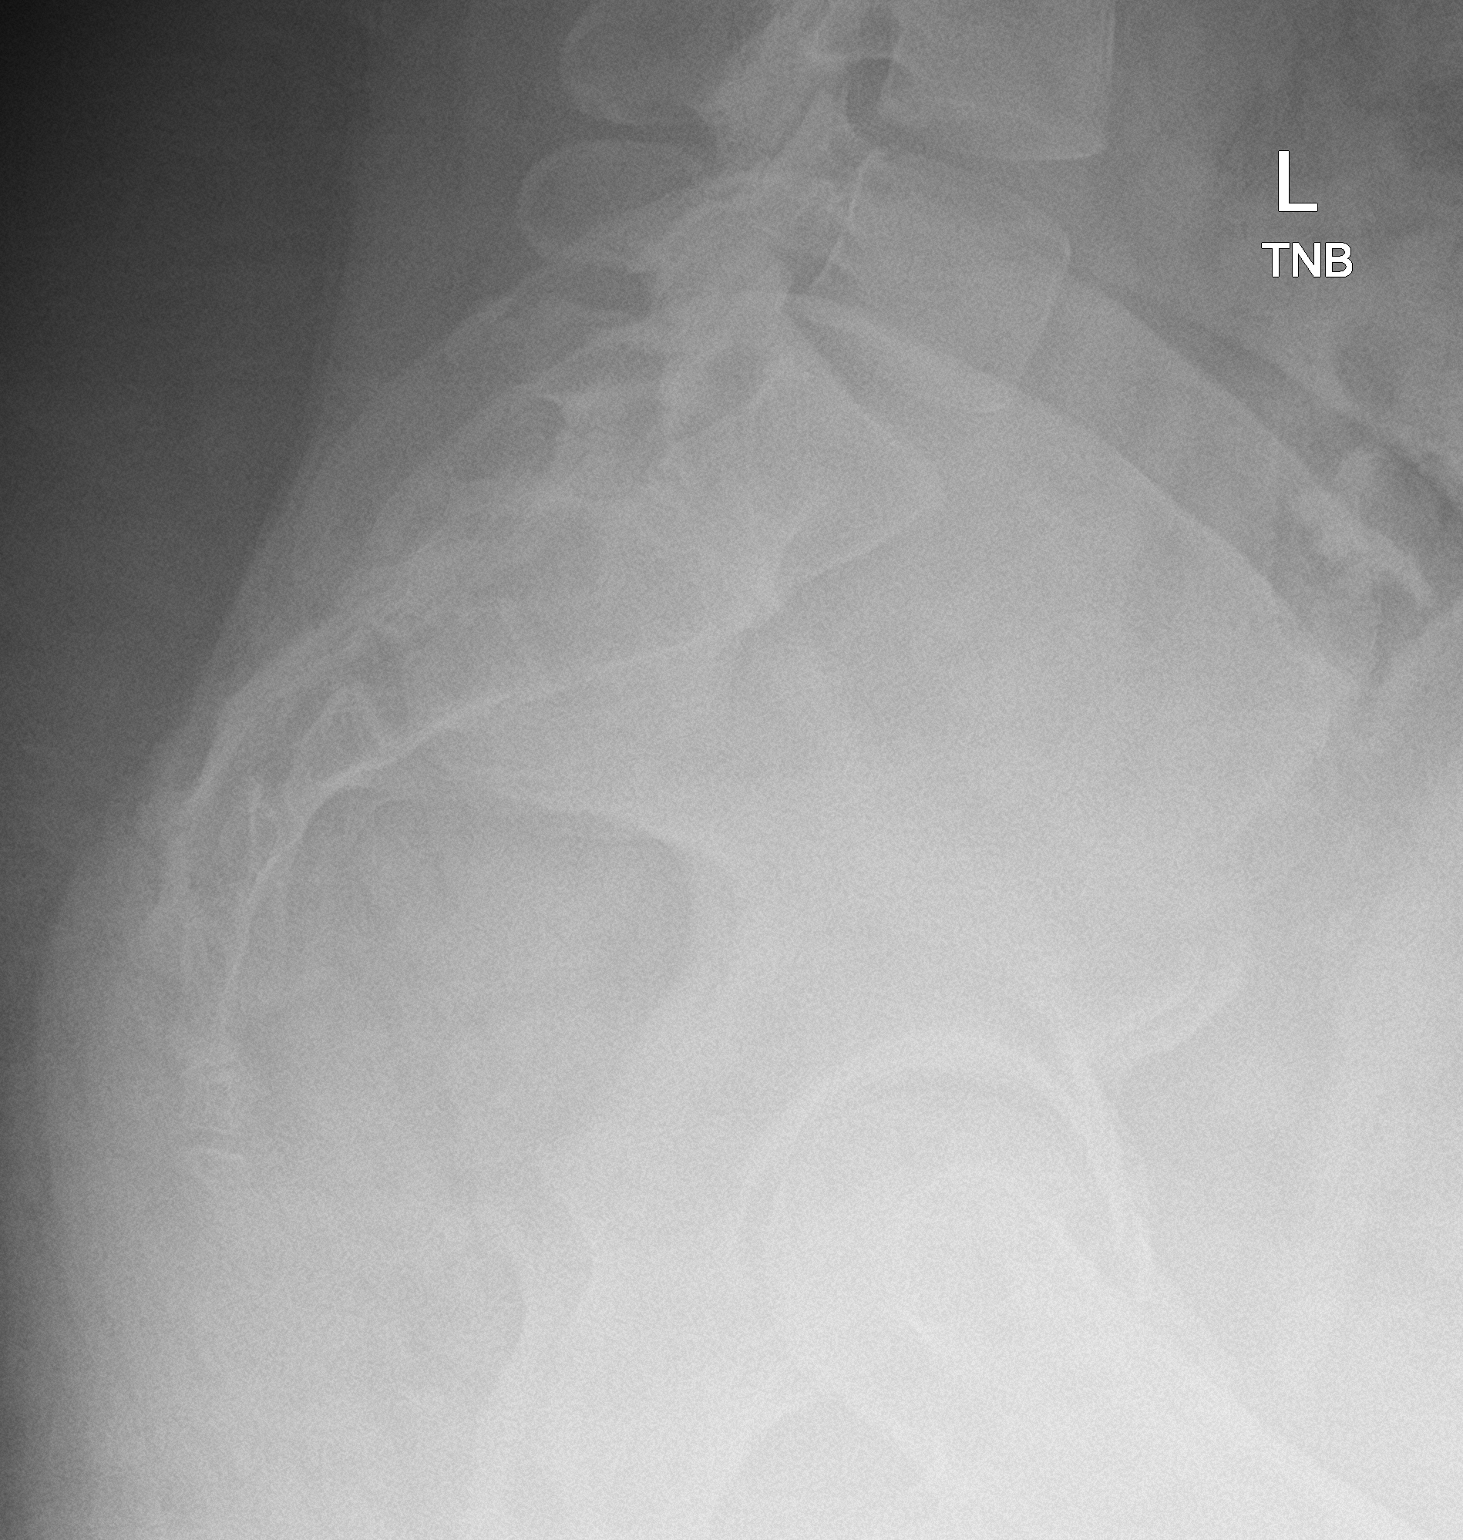

[2 of 2 positions shown; findings below may reference images not displayed]

FINDINGS: Pelvic ring appears intact. Sacral ala are intact. No fracture is
seen. No dislocation is noted. No soft tissue abnormality is seen.
IMPRESSION: No acute abnormality noted.
